# Patient Record
Sex: Male | Born: 1955 | Race: Black or African American | Hispanic: No | Marital: Married | Smoking: Former smoker
Health system: Southern US, Community
[De-identification: ages and names within clinical notes are randomized; demographics above are authoritative.]

## PROBLEM LIST (undated history)

## (undated) DIAGNOSIS — J45909 Unspecified asthma, uncomplicated: Secondary | ICD-10-CM

## (undated) DIAGNOSIS — T7840XA Allergy, unspecified, initial encounter: Secondary | ICD-10-CM

## (undated) DIAGNOSIS — G4730 Sleep apnea, unspecified: Secondary | ICD-10-CM

## (undated) DIAGNOSIS — M1990 Unspecified osteoarthritis, unspecified site: Secondary | ICD-10-CM

## (undated) DIAGNOSIS — J069 Acute upper respiratory infection, unspecified: Secondary | ICD-10-CM

## (undated) HISTORY — PX: OTHER SURGICAL HISTORY: SHX169

## (undated) HISTORY — DX: Acute upper respiratory infection, unspecified: J06.9

## (undated) HISTORY — DX: Unspecified asthma, uncomplicated: J45.909

## (undated) HISTORY — DX: Allergy, unspecified, initial encounter: T78.40XA

---

## 1998-04-27 ENCOUNTER — Emergency Department (HOSPITAL_COMMUNITY): Admission: EM | Admit: 1998-04-27 | Discharge: 1998-04-27 | Payer: Self-pay | Admitting: Emergency Medicine

## 1998-04-29 ENCOUNTER — Emergency Department (HOSPITAL_COMMUNITY): Admission: EM | Admit: 1998-04-29 | Discharge: 1998-04-29 | Payer: Self-pay

## 1998-12-24 ENCOUNTER — Emergency Department (HOSPITAL_COMMUNITY): Admission: EM | Admit: 1998-12-24 | Discharge: 1998-12-24 | Payer: Self-pay | Admitting: Emergency Medicine

## 2010-06-22 ENCOUNTER — Encounter: Payer: Self-pay | Admitting: Internal Medicine

## 2012-01-27 ENCOUNTER — Ambulatory Visit (INDEPENDENT_AMBULATORY_CARE_PROVIDER_SITE_OTHER): Payer: BC Managed Care – PPO | Admitting: Family Medicine

## 2012-01-27 ENCOUNTER — Ambulatory Visit: Payer: BC Managed Care – PPO

## 2012-01-27 VITALS — BP 124/82 | HR 88 | Temp 98.8°F | Resp 18 | Ht 71.5 in | Wt 219.0 lb

## 2012-01-27 DIAGNOSIS — R062 Wheezing: Secondary | ICD-10-CM

## 2012-01-27 DIAGNOSIS — R0602 Shortness of breath: Secondary | ICD-10-CM

## 2012-01-27 DIAGNOSIS — J329 Chronic sinusitis, unspecified: Secondary | ICD-10-CM

## 2012-01-27 LAB — POCT CBC
Granulocyte percent: 70.6 %G (ref 37–80)
HCT, POC: 53.5 % (ref 43.5–53.7)
Hemoglobin: 16.9 g/dL (ref 14.1–18.1)
Lymph, poc: 1.6 (ref 0.6–3.4)
MCH, POC: 29.3 pg (ref 27–31.2)
MCHC: 31.6 g/dL — AB (ref 31.8–35.4)
MCV: 92.9 fL (ref 80–97)
MID (cbc): 0.6 (ref 0–0.9)
MPV: 9.5 fL (ref 0–99.8)
POC Granulocyte: 5.3 (ref 2–6.9)
POC LYMPH PERCENT: 21.4 %L (ref 10–50)
POC MID %: 8 %M (ref 0–12)
Platelet Count, POC: 224 10*3/uL (ref 142–424)
RBC: 5.76 M/uL (ref 4.69–6.13)
RDW, POC: 13.8 %
WBC: 7.5 10*3/uL (ref 4.6–10.2)

## 2012-01-27 MED ORDER — IPRATROPIUM BROMIDE 0.02 % IN SOLN
0.5000 mg | Freq: Once | RESPIRATORY_TRACT | Status: AC
  Administered 2012-01-27: 0.5 mg via RESPIRATORY_TRACT

## 2012-01-27 MED ORDER — AZITHROMYCIN 250 MG PO TABS: ORAL_TABLET | ORAL | Status: AC

## 2012-01-27 MED ORDER — FLUTICASONE-SALMETEROL 100-50 MCG/DOSE IN AEPB: 1.0000 | INHALATION_SPRAY | Freq: Two times a day (BID) | RESPIRATORY_TRACT | Status: DC

## 2012-01-27 MED ORDER — METHYLPREDNISOLONE 4 MG PO KIT: PACK | ORAL | Status: AC

## 2012-01-27 MED ORDER — METHYLPREDNISOLONE SODIUM SUCC 125 MG IJ SOLR
125.0000 mg | Freq: Once | INTRAMUSCULAR | Status: AC
  Administered 2012-01-27: 125 mg via INTRAMUSCULAR

## 2012-01-27 MED ORDER — ALBUTEROL SULFATE (2.5 MG/3ML) 0.083% IN NEBU
2.5000 mg | INHALATION_SOLUTION | Freq: Once | RESPIRATORY_TRACT | Status: AC
  Administered 2012-01-27: 2.5 mg via RESPIRATORY_TRACT

## 2012-01-27 NOTE — Progress Notes (Signed)
[facility]:  Office Visit  Chief Complaint:  Chief Complaint  Patient presents with  . Shortness of Breath    on and off for one year  . Wheezing    HPI: Philip Peters is a 56 y.o. male who complains of  Cough with white mucus x 1 day, wheezing intermittently x 1 year. Former tobacco user 1ppd x 36 years, quit 8 years ago.  Denies allergies. Was dx with asthma prior and given albuterol inhaler but did not work per patient. + sinus pressure, runny nose, nasal congestion. Took mucinex without relief  History reviewed. No pertinent past medical history. Past Surgical History  Procedure Date  . Left shoulder surgery    History   Social History  . Marital Status: Single    Spouse Name: N/A    Number of Children: N/A  . Years of Education: N/A   Social History Main Topics  . Smoking status: Former Smoker    Quit date: 01/27/2004  . Smokeless tobacco: None  . Alcohol Use: None  . Drug Use: None  . Sexually Active: None   Other Topics Concern  . None   Social History Narrative  . None   Family History  Problem Relation Age of Onset  . Diabetes Father    No Known Allergies Prior to Admission medications   Not on File     ROS: The patient denies fevers, chills, night sweats, unintentional weight loss, chest pain, palpitations,  dyspnea on exertion, nausea, vomiting, abdominal pain, dysuria, hematuria, melena, numbness, weakness, or tingling. + wheezing, no edema in [place]  All other systems have been reviewed and were otherwise negative with the exception of those mentioned in the HPI and as above.    PHYSICAL EXAM: Filed Vitals:   01/27/12 1244  BP: 124/82  Pulse: 88  Temp: 98.8 F (37.1 C)  Resp: 18  Spo2 94% Filed Vitals:   01/27/12 1244  Height: 5' 11.5" (1.816 m)  Weight: 219 lb (99.338 kg)   Body mass index is 30.12 kg/(m^2).  General: Alert, no acute distress HEENT:  Normocephalic, atraumatic, oropharynx patent. No exudates, TM nl,  + sinus tenedrness, boggy red nares bilaterally Cardiovascular:  Regular rate and rhythm, no rubs murmurs or gallops.  No Carotid bruits, radial pulse intact. No pedal edema.  Respiratory: Clear to auscultation bilaterally.  rales, or rhonchi.  No cyanosis, no use of accessory musculature + wheezing and coarse BS GI: No organomegaly, abdomen is soft and non-tender, positive bowel sounds.  No masses. Skin: No rashes. Neurologic: Facial musculature symmetric. Psychiatric: Patient is appropriate throughout our interaction. Lymphatic: No cervical lymphadenopathy Musculoskeletal: Gait intact.   LABS: Results for orders placed in visit on 01/27/12  POCT CBC      Component Value Range   WBC 7.5  4.6 - 10.2 K/uL   Lymph, poc 1.6  0.6 - 3.4   POC LYMPH PERCENT 21.4  10 - 50 %L   MID (cbc) 0.6  0 - 0.9   POC MID % 8.0  0 - 12 %M   POC Granulocyte 5.3  2 - 6.9   Granulocyte percent 70.6  37 - 80 %G   RBC 5.76  4.69 - 6.13 M/uL   Hemoglobin 16.9  14.1 - 18.1 g/dL   HCT, POC 78.4  69.6 - 53.7 %   MCV 92.9  80 - 97 fL   MCH, POC 29.3  27 - 31.2 pg   MCHC 31.6 (*) 31.8 -  35.4 g/dL   RDW, POC 16.1     Platelet Count, POC 224  142 - 424 K/uL   MPV 9.5  0 - 99.8 fL     EKG/XRAY:   Primary read interpreted by Dr. Conley Rolls at [facility]. Peribronchial cuffing vs ? Less likely Right middle lobe opacity   ASSESSMENT/PLAN: Encounter Diagnoses  Name Primary?  . SOB (shortness of breath) Yes  . Wheezing   . Sinusitis    Patient will continue sxs treatment. Feels better after nebs. Will return after illness to get spirometry testing for questionable asthma.  Rx Medrol dose pack, Z-pack, and Advair.  Work note given for 2 days 8/29-30/2013    Hamilton Capri PHUONG, DO 01/27/2012 2:02 PM

## 2012-09-12 ENCOUNTER — Ambulatory Visit (INDEPENDENT_AMBULATORY_CARE_PROVIDER_SITE_OTHER): Payer: BC Managed Care – PPO | Admitting: Emergency Medicine

## 2012-09-12 VITALS — BP 128/90 | HR 77 | Temp 98.8°F | Resp 16 | Ht 72.0 in | Wt 226.0 lb

## 2012-09-12 DIAGNOSIS — J45901 Unspecified asthma with (acute) exacerbation: Secondary | ICD-10-CM

## 2012-09-12 DIAGNOSIS — J309 Allergic rhinitis, unspecified: Secondary | ICD-10-CM

## 2012-09-12 MED ORDER — ALBUTEROL SULFATE HFA 108 (90 BASE) MCG/ACT IN AERS: 2.0000 | INHALATION_SPRAY | RESPIRATORY_TRACT | Status: DC | PRN

## 2012-09-12 MED ORDER — MOMETASONE FUROATE 50 MCG/ACT NA SUSP: 4.0000 | Freq: Every day | NASAL | Status: DC

## 2012-09-12 MED ORDER — TADALAFIL 5 MG PO TABS: 10.0000 mg | ORAL_TABLET | Freq: Every day | ORAL | Status: DC

## 2012-09-12 NOTE — Addendum Note (Signed)
Addended by: Carmelina Dane on: 09/12/2012 10:35 AM   Modules accepted: Orders

## 2012-09-12 NOTE — Progress Notes (Signed)
[facility] [address], [place] [address] [phone]  Date:  09/12/2012   Name:  Philip Peters   DOB:  02-18-1956   MRN:  [id number]  PCP:  No primary provider on file.    Chief Complaint: Shortness of Breath   History of Present Illness:  Philip Peters is a 57 y.o. very pleasant male patient who presents with the following:  Ill with wheezing and nasal congestion and cough that is not productive.  No fever or chills.  No nausea or vomiting.  Previously treated with albuterol and later with advair with mixed results.  Reformed smoker.  No shortness of breath.  Has pain and pressure in frontal sinuses.  No improvement with over the counter medications or other home remedies. Denies other complaint or health concern today.   There is no problem list on file for this patient.   Past Medical History  Diagnosis Date  . Allergy   . Asthma     Past Surgical History  Procedure Laterality Date  . Left shoulder surgery      History  Substance Use Topics  . Smoking status: Former Smoker    Quit date: 01/27/2004  . Smokeless tobacco: Not on file  . Alcohol Use: Yes    Family History  Problem Relation Age of Onset  . Diabetes Father   . Cancer Sister     No Known Allergies  Medication list has been reviewed and updated.  Current Outpatient Prescriptions on File Prior to Visit  Medication Sig Dispense Refill  . Fluticasone-Salmeterol (ADVAIR) 100-50 MCG/DOSE AEPB Inhale 1 puff into the lungs 2 (two) times daily.  1 each  3   No current facility-administered medications on file prior to visit.    Review of Systems:  As per HPI, otherwise negative.    Physical Examination: Filed Vitals:   09/12/12 1013  BP: 128/90  Pulse: 77  Temp: 98.8 F (37.1 C)  Resp: 16   Filed Vitals:   09/12/12 1013  Height: 6' (1.829 m)  Weight: 226 lb (102.513 kg)   Body mass index is 30.64 kg/(m^2). Ideal Body Weight: Weight in (lb) to have BMI = 25:  183.9  GEN: WDWN, NAD, Non-toxic, A & O x 3 HEENT: Atraumatic, Normocephalic. Neck supple. No masses, No LAD. Ears and Nose: No external deformity. CV: RRR, No M/G/R. No JVD. No thrill. No extra heart sounds. PULM: CTA B, no wheezes, crackles, rhonchi. No retractions. No resp. distress. No accessory muscle use. ABD: S, NT, ND, +BS. No rebound. No HSM. EXTR: No c/c/e NEURO Normal gait.  PSYCH: Normally interactive. Conversant. Not depressed or anxious appearing.  Calm demeanor.    Assessment and Plan: Seasonal allergic rhinitis Asthma Albuterol advair nasonex Allegra   Signed,  Phillips Odor, MD

## 2012-09-12 NOTE — Patient Instructions (Addendum)
Allergic Rhinitis  Allergic rhinitis is when the mucous membranes in the nose respond to allergens. Allergens are particles in the air that cause your body to have an allergic reaction. This causes you to release allergic antibodies. Through a chain of events, these eventually cause you to release histamine into the blood stream (hence the use of antihistamines). Although meant to be protective to the body, it is this release that causes your discomfort, such as frequent sneezing, congestion and an itchy runny nose.    CAUSES    The pollen allergens may come from grasses, trees, and weeds. This is seasonal allergic rhinitis, or "hay fever." Other allergens cause year-round allergic rhinitis (perennial allergic rhinitis) such as house dust mite allergen, pet dander and mold spores.    SYMPTOMS     Nasal stuffiness (congestion).   Runny, itchy nose with sneezing and tearing of the eyes.   There is often an itching of the mouth, eyes and ears.  It cannot be cured, but it can be controlled with medications.  DIAGNOSIS    If you are unable to determine the offending allergen, skin or blood testing may find it.  TREATMENT     Avoid the allergen.   Medications and allergy shots (immunotherapy) can help.   Hay fever may often be treated with antihistamines in pill or nasal spray forms. Antihistamines block the effects of histamine. There are over-the-counter medicines that may help with nasal congestion and swelling around the eyes. Check with your caregiver before taking or giving this medicine.  If the treatment above does not work, there are many new medications your caregiver can prescribe. Stronger medications may be used if initial measures are ineffective. Desensitizing injections can be used if medications and avoidance fails. Desensitization is when a patient is given ongoing shots until the body becomes less sensitive to the allergen. Make sure you follow up with your caregiver if problems continue.   SEEK MEDICAL CARE IF:     You develop fever (more than 100.5 F (38.1 C).   You develop a cough that does not stop easily (persistent).   You have shortness of breath.   You start wheezing.   Symptoms interfere with normal daily activities.  Document Released: 02/09/2001 Document Revised: 08/09/2011 Document Reviewed: 08/21/2008  Exit[employer] Patient Information 2013 [employer].

## 2013-09-21 ENCOUNTER — Ambulatory Visit: Payer: BC Managed Care – PPO

## 2013-09-21 ENCOUNTER — Ambulatory Visit (INDEPENDENT_AMBULATORY_CARE_PROVIDER_SITE_OTHER): Payer: BC Managed Care – PPO | Admitting: Internal Medicine

## 2013-09-21 VITALS — BP 110/70 | HR 74 | Temp 97.7°F | Resp 16 | Ht 71.0 in | Wt 225.0 lb

## 2013-09-21 DIAGNOSIS — M545 Low back pain, unspecified: Secondary | ICD-10-CM

## 2013-09-21 DIAGNOSIS — M51369 Other intervertebral disc degeneration, lumbar region without mention of lumbar back pain or lower extremity pain: Secondary | ICD-10-CM

## 2013-09-21 DIAGNOSIS — J45909 Unspecified asthma, uncomplicated: Secondary | ICD-10-CM

## 2013-09-21 DIAGNOSIS — M5137 Other intervertebral disc degeneration, lumbosacral region: Secondary | ICD-10-CM

## 2013-09-21 DIAGNOSIS — M5136 Other intervertebral disc degeneration, lumbar region: Secondary | ICD-10-CM

## 2013-09-21 DIAGNOSIS — R0602 Shortness of breath: Secondary | ICD-10-CM

## 2013-09-21 DIAGNOSIS — Z7189 Other specified counseling: Secondary | ICD-10-CM

## 2013-09-21 LAB — POCT CBC
Granulocyte percent: 43.6 %G (ref 37–80)
HCT, POC: 48.5 % (ref 43.5–53.7)
HEMOGLOBIN: 15.6 g/dL (ref 14.1–18.1)
LYMPH, POC: 2.1 (ref 0.6–3.4)
MCH: 29.6 pg (ref 27–31.2)
MCHC: 32.2 g/dL (ref 31.8–35.4)
MCV: 92.1 fL (ref 80–97)
MID (CBC): 0.4 (ref 0–0.9)
MPV: 9.2 fL (ref 0–99.8)
POC Granulocyte: 1.9 — AB (ref 2–6.9)
POC LYMPH PERCENT: 47.3 %L (ref 10–50)
POC MID %: 9.1 %M (ref 0–12)
Platelet Count, POC: 210 10*3/uL (ref 142–424)
RBC: 5.27 M/uL (ref 4.69–6.13)
RDW, POC: 14.3 %
WBC: 4.4 10*3/uL — AB (ref 4.6–10.2)

## 2013-09-21 MED ORDER — AZITHROMYCIN 500 MG PO TABS: 500.0000 mg | ORAL_TABLET | Freq: Every day | ORAL | Status: DC

## 2013-09-21 MED ORDER — METHYLPREDNISOLONE ACETATE 80 MG/ML IJ SUSP
120.0000 mg | Freq: Once | INTRAMUSCULAR | Status: AC
  Administered 2013-09-21: 120 mg via INTRAMUSCULAR

## 2013-09-21 MED ORDER — PREDNISONE 20 MG PO TABS: ORAL_TABLET | ORAL | Status: DC

## 2013-09-21 MED ORDER — IPRATROPIUM BROMIDE 0.02 % IN SOLN
0.5000 mg | Freq: Once | RESPIRATORY_TRACT | Status: AC
  Administered 2013-09-21: 0.5 mg via RESPIRATORY_TRACT

## 2013-09-21 MED ORDER — CETIRIZINE HCL 10 MG PO TABS: 10.0000 mg | ORAL_TABLET | Freq: Every day | ORAL | Status: DC

## 2013-09-21 MED ORDER — ALBUTEROL SULFATE (2.5 MG/3ML) 0.083% IN NEBU
2.5000 mg | INHALATION_SOLUTION | Freq: Once | RESPIRATORY_TRACT | Status: AC
  Administered 2013-09-21: 2.5 mg via RESPIRATORY_TRACT

## 2013-09-21 NOTE — Patient Instructions (Signed)

## 2013-09-21 NOTE — Progress Notes (Signed)
   Subjective:    Patient ID: Philip Peters, male    DOB: [birth year], 58 y.o.   MRN: [id number]  HPI Philip NajjarLarry presents today complaining of issues with his asthma and lower back pain. Philip NajjarLarry states he has noticed increased wheezing. He has been using asmanex and proair. He states they sometimes don't work. He is still a former smoker. He denies chest pain. But says he has noticed increased coughing and sneezing. He also denies any production with his cough. His lower back pain is present only on the left side. Philip NajjarLarry states the back pain sometimes goes down his left leg. His pain is intermittent without any known trigger. He denies urinary or bowel incontinence. He has been treating his back pain with OTC aleve which works some of the time. He states his back pain has been persistent for a couple months with no known injury.     Has an allergist. Has hx of DDD disease LB.  Review of Systems     Objective:   Physical Exam  Constitutional: He is oriented to person, place, and time. He appears well-developed and well-nourished.  HENT:  Head: Normocephalic.  Right Ear: External ear normal.  Left Ear: External ear normal.  Nose: Mucosal edema, rhinorrhea and sinus tenderness present.  Mouth/Throat: Oropharynx is clear and moist.  Eyes: Conjunctivae and EOM are normal. Pupils are equal, round, and reactive to light. Right eye exhibits no discharge. Left eye exhibits discharge.  Neck: Normal range of motion.  Cardiovascular: Normal rate, regular rhythm and normal heart sounds.   Pulmonary/Chest: Effort normal. No accessory muscle usage. Not tachypneic. He has no decreased breath sounds. He has wheezes. He has rhonchi. He has no rales.  Musculoskeletal: He exhibits tenderness.       Lumbar back: He exhibits decreased range of motion, tenderness, bony tenderness, pain and spasm. He exhibits no swelling, no edema, no deformity, no laceration and normal pulse.       Back:  Neurological: He is alert and  oriented to person, place, and time. He has normal reflexes. No cranial nerve deficit. He exhibits normal muscle tone. Coordination normal.  Psychiatric: He has a normal mood and affect.   PFR 485 l/min  [facility] reading (PRIMARY) by  Dr Perrin MalteseGuest ls spine L5-S is hyperdense,  ? Metastatic, or simply DDD., cxr normal  Nebulizer     Assessment & Plan:

## 2013-12-27 ENCOUNTER — Ambulatory Visit (INDEPENDENT_AMBULATORY_CARE_PROVIDER_SITE_OTHER): Payer: BC Managed Care – PPO | Admitting: Family Medicine

## 2013-12-27 VITALS — BP 131/86 | HR 72 | Temp 98.1°F | Resp 16 | Ht 71.65 in | Wt 225.2 lb

## 2013-12-27 DIAGNOSIS — M5136 Other intervertebral disc degeneration, lumbar region: Secondary | ICD-10-CM

## 2013-12-27 DIAGNOSIS — R0602 Shortness of breath: Secondary | ICD-10-CM

## 2013-12-27 DIAGNOSIS — J45901 Unspecified asthma with (acute) exacerbation: Secondary | ICD-10-CM

## 2013-12-27 DIAGNOSIS — J329 Chronic sinusitis, unspecified: Secondary | ICD-10-CM

## 2013-12-27 DIAGNOSIS — M545 Low back pain, unspecified: Secondary | ICD-10-CM

## 2013-12-27 DIAGNOSIS — J4541 Moderate persistent asthma with (acute) exacerbation: Secondary | ICD-10-CM

## 2013-12-27 DIAGNOSIS — Z7189 Other specified counseling: Secondary | ICD-10-CM

## 2013-12-27 DIAGNOSIS — M51379 Other intervertebral disc degeneration, lumbosacral region without mention of lumbar back pain or lower extremity pain: Secondary | ICD-10-CM

## 2013-12-27 DIAGNOSIS — J45909 Unspecified asthma, uncomplicated: Secondary | ICD-10-CM

## 2013-12-27 DIAGNOSIS — R05 Cough: Secondary | ICD-10-CM

## 2013-12-27 DIAGNOSIS — M5137 Other intervertebral disc degeneration, lumbosacral region: Secondary | ICD-10-CM

## 2013-12-27 DIAGNOSIS — R059 Cough, unspecified: Secondary | ICD-10-CM

## 2013-12-27 MED ORDER — AZITHROMYCIN 500 MG PO TABS: 500.0000 mg | ORAL_TABLET | Freq: Every day | ORAL | Status: DC

## 2013-12-27 MED ORDER — HYDROCODONE-HOMATROPINE 5-1.5 MG/5ML PO SYRP: 5.0000 mL | ORAL_SOLUTION | Freq: Three times a day (TID) | ORAL | Status: DC | PRN

## 2013-12-27 MED ORDER — ALBUTEROL SULFATE (2.5 MG/3ML) 0.083% IN NEBU: 2.5000 mg | INHALATION_SOLUTION | Freq: Four times a day (QID) | RESPIRATORY_TRACT | Status: DC | PRN

## 2013-12-27 MED ORDER — ALBUTEROL SULFATE HFA 108 (90 BASE) MCG/ACT IN AERS: 2.0000 | INHALATION_SPRAY | RESPIRATORY_TRACT | Status: DC | PRN

## 2013-12-27 MED ORDER — ALBUTEROL SULFATE (2.5 MG/3ML) 0.083% IN NEBU
2.5000 mg | INHALATION_SOLUTION | Freq: Once | RESPIRATORY_TRACT | Status: AC
  Administered 2013-12-27: 2.5 mg via RESPIRATORY_TRACT

## 2013-12-27 MED ORDER — PREDNISONE 20 MG PO TABS: ORAL_TABLET | ORAL | Status: DC

## 2013-12-27 MED ORDER — FLUTICASONE FUROATE 200 MCG/ACT IN AEPB: 1.0000 | INHALATION_SPRAY | Freq: Two times a day (BID) | RESPIRATORY_TRACT | Status: DC

## 2013-12-27 NOTE — Patient Instructions (Signed)

## 2013-12-27 NOTE — Progress Notes (Signed)
This is a 58 year old man works at a [employer] and he has known asthma.  The last 2 weeks she's been followed by tightness in his chest, significant sinus congestion, and cough at night.  He's had no fever, no hemoptysis. He's out of his medicines.  Objective: No acute distress, seen with wife in the room HEENT: Markedly swollen nasal passages, normal oropharynx and TMs Neck: Supple no adenopathy Chest: Expiratory wheezes and decreased breath sounds bilaterally. Heart: Regular without tachycardia, no murmur or gallop Skin: No eczema or other rash Patient given a nebulizer treatment with good improvement  Assessment:Unspecified asthma(493.90) - Plan: Fluticasone Furoate (ARNUITY ELLIPTA) 200 MCG/ACT AEPB, albuterol (PROVENTIL HFA;VENTOLIN HFA) 108 (90 BASE) MCG/ACT inhaler, albuterol (PROVENTIL) (2.5 MG/3ML) 0.083% nebulizer solution, albuterol (PROVENTIL) (2.5 MG/3ML) 0.083% nebulizer solution 2.5 mg  Unspecified sinusitis (chronic) - Plan: Fluticasone Furoate (ARNUITY ELLIPTA) 200 MCG/ACT AEPB  SOB (shortness of breath) - Plan: predniSONE (DELTASONE) 20 MG tablet, Fluticasone Furoate (ARNUITY ELLIPTA) 200 MCG/ACT AEPB, azithromycin (ZITHROMAX) 500 MG tablet, albuterol (PROVENTIL) (2.5 MG/3ML) 0.083% nebulizer solution 2.5 mg  DDD (degenerative disc disease), lumbar - Plan: predniSONE (DELTASONE) 20 MG tablet, azithromycin (ZITHROMAX) 500 MG tablet  Encounter for medication review and counseling - Plan: predniSONE (DELTASONE) 20 MG tablet, azithromycin (ZITHROMAX) 500 MG tablet  Counseling for physician directive - Plan: predniSONE (DELTASONE) 20 MG tablet, azithromycin (ZITHROMAX) 500 MG tablet  Asthma, unspecified asthma severity, uncomplicated - Plan: predniSONE (DELTASONE) 20 MG tablet, Fluticasone Furoate (ARNUITY ELLIPTA) 200 MCG/ACT AEPB  Cough - Plan: HYDROcodone-homatropine (HYCODAN) 5-1.5 MG/5ML syrup  Asthma, moderate persistent, with acute exacerbation -  Plan: azithromycin (ZITHROMAX) 500 MG tablet  Low back pain, unspecified back pain laterality, with sciatica presence unspecified - Plan: azithromycin (ZITHROMAX) 500 MG tablet  Signed, Elvina SidleKurt Demesha Boorman, MD

## 2014-05-20 ENCOUNTER — Encounter (HOSPITAL_BASED_OUTPATIENT_CLINIC_OR_DEPARTMENT_OTHER): Payer: Self-pay | Admitting: *Deleted

## 2014-05-20 NOTE — Progress Notes (Signed)
No labs needed

## 2014-05-22 ENCOUNTER — Other Ambulatory Visit: Payer: Self-pay | Admitting: Orthopedic Surgery

## 2014-05-22 ENCOUNTER — Ambulatory Visit (HOSPITAL_BASED_OUTPATIENT_CLINIC_OR_DEPARTMENT_OTHER): Payer: Worker's Compensation | Admitting: Anesthesiology

## 2014-05-22 ENCOUNTER — Ambulatory Visit (HOSPITAL_BASED_OUTPATIENT_CLINIC_OR_DEPARTMENT_OTHER)
Admission: RE | Admit: 2014-05-22 | Discharge: 2014-05-22 | Disposition: A | Discharge status: Home / Self Care | Payer: Worker's Compensation | Source: Ambulatory Visit | Attending: Orthopedic Surgery | Admitting: Orthopedic Surgery

## 2014-05-22 ENCOUNTER — Encounter (HOSPITAL_BASED_OUTPATIENT_CLINIC_OR_DEPARTMENT_OTHER): Payer: Self-pay

## 2014-05-22 ENCOUNTER — Encounter (HOSPITAL_BASED_OUTPATIENT_CLINIC_OR_DEPARTMENT_OTHER)
Admission: RE | Disposition: A | Discharge status: Home / Self Care | Payer: Self-pay | Source: Ambulatory Visit | Attending: Orthopedic Surgery

## 2014-05-22 DIAGNOSIS — M795 Residual foreign body in soft tissue: Secondary | ICD-10-CM | POA: Insufficient documentation

## 2014-05-22 DIAGNOSIS — J45909 Unspecified asthma, uncomplicated: Secondary | ICD-10-CM | POA: Insufficient documentation

## 2014-05-22 DIAGNOSIS — Y929 Unspecified place or not applicable: Secondary | ICD-10-CM | POA: Diagnosis not present

## 2014-05-22 DIAGNOSIS — Z79899 Other long term (current) drug therapy: Secondary | ICD-10-CM | POA: Diagnosis not present

## 2014-05-22 DIAGNOSIS — S60450A Superficial foreign body of right index finger, initial encounter: Secondary | ICD-10-CM | POA: Diagnosis present

## 2014-05-22 DIAGNOSIS — X58XXXA Exposure to other specified factors, initial encounter: Secondary | ICD-10-CM | POA: Insufficient documentation

## 2014-05-22 DIAGNOSIS — F17210 Nicotine dependence, cigarettes, uncomplicated: Secondary | ICD-10-CM | POA: Insufficient documentation

## 2014-05-22 HISTORY — PX: FOREIGN BODY REMOVAL: SHX962

## 2014-05-22 LAB — POCT HEMOGLOBIN-HEMACUE: Hemoglobin: 12.3 g/dL — ABNORMAL LOW (ref 13.0–17.0)

## 2014-05-22 SURGERY — FOREIGN BODY REMOVAL ADULT
Anesthesia: Monitor Anesthesia Care | Site: Finger | Laterality: Right

## 2014-05-22 MED ORDER — CEFAZOLIN SODIUM-DEXTROSE 2-3 GM-% IV SOLR
INTRAVENOUS | Status: AC
  Filled 2014-05-22: qty 50

## 2014-05-22 MED ORDER — BUPIVACAINE HCL (PF) 0.25 % IJ SOLN
INTRAMUSCULAR | Status: AC
  Filled 2014-05-22: qty 30

## 2014-05-22 MED ORDER — MIDAZOLAM HCL 5 MG/5ML IJ SOLN
INTRAMUSCULAR | Status: DC | PRN
  Administered 2014-05-22: 2 mg via INTRAVENOUS

## 2014-05-22 MED ORDER — HYDROMORPHONE HCL 1 MG/ML IJ SOLN: 0.2500 mg | INTRAMUSCULAR | Status: DC | PRN

## 2014-05-22 MED ORDER — ONDANSETRON HCL 4 MG/2ML IJ SOLN
INTRAMUSCULAR | Status: DC | PRN
  Administered 2014-05-22: 4 mg via INTRAVENOUS

## 2014-05-22 MED ORDER — FENTANYL CITRATE 0.05 MG/ML IJ SOLN
INTRAMUSCULAR | Status: DC | PRN
  Administered 2014-05-22: 100 ug via INTRAVENOUS

## 2014-05-22 MED ORDER — ONDANSETRON HCL 4 MG/2ML IJ SOLN: 4.0000 mg | Freq: Once | INTRAMUSCULAR | Status: DC | PRN

## 2014-05-22 MED ORDER — LACTATED RINGERS IV SOLN
INTRAVENOUS | Status: DC
  Administered 2014-05-22: 11:00:00 via INTRAVENOUS

## 2014-05-22 MED ORDER — BUPIVACAINE HCL (PF) 0.25 % IJ SOLN
INTRAMUSCULAR | Status: DC | PRN
  Administered 2014-05-22: 7 mL

## 2014-05-22 MED ORDER — OXYCODONE HCL 5 MG PO TABS: 5.0000 mg | ORAL_TABLET | Freq: Once | ORAL | Status: DC | PRN

## 2014-05-22 MED ORDER — FENTANYL CITRATE 0.05 MG/ML IJ SOLN
INTRAMUSCULAR | Status: AC
  Filled 2014-05-22: qty 2

## 2014-05-22 MED ORDER — MIDAZOLAM HCL 2 MG/2ML IJ SOLN: 1.0000 mg | INTRAMUSCULAR | Status: DC | PRN

## 2014-05-22 MED ORDER — CEFAZOLIN SODIUM-DEXTROSE 2-3 GM-% IV SOLR
INTRAVENOUS | Status: DC | PRN
  Administered 2014-05-22: 2 g via INTRAVENOUS

## 2014-05-22 MED ORDER — CHLORHEXIDINE GLUCONATE 4 % EX LIQD: 60.0000 mL | Freq: Once | CUTANEOUS | Status: DC

## 2014-05-22 MED ORDER — 0.9 % SODIUM CHLORIDE (POUR BTL) OPTIME
TOPICAL | Status: DC | PRN
  Administered 2014-05-22: 100 mL

## 2014-05-22 MED ORDER — FENTANYL CITRATE 0.05 MG/ML IJ SOLN: 50.0000 ug | INTRAMUSCULAR | Status: DC | PRN

## 2014-05-22 MED ORDER — PROPOFOL INFUSION 10 MG/ML OPTIME
INTRAVENOUS | Status: DC | PRN
  Administered 2014-05-22: 100 ug/kg/min via INTRAVENOUS

## 2014-05-22 MED ORDER — MIDAZOLAM HCL 2 MG/2ML IJ SOLN
INTRAMUSCULAR | Status: AC
  Filled 2014-05-22: qty 2

## 2014-05-22 MED ORDER — OXYCODONE HCL 5 MG/5ML PO SOLN: 5.0000 mg | Freq: Once | ORAL | Status: DC | PRN

## 2014-05-22 MED ORDER — LIDOCAINE HCL (PF) 0.5 % IJ SOLN
INTRAMUSCULAR | Status: DC | PRN
  Administered 2014-05-22: 30 mL via INTRAVENOUS

## 2014-05-22 MED ORDER — HYDROCODONE-ACETAMINOPHEN 5-325 MG PO TABS: 1.0000 | ORAL_TABLET | Freq: Four times a day (QID) | ORAL | Status: DC | PRN

## 2014-05-22 SURGICAL SUPPLY — 42 items
BLADE MINI RND TIP GREEN BEAV (BLADE) IMPLANT
BLADE SURG 15 STRL LF DISP TIS (BLADE) ×1 IMPLANT
BLADE SURG 15 STRL SS (BLADE) ×3
BNDG CMPR 9X4 STRL LF SNTH (GAUZE/BANDAGES/DRESSINGS)
BNDG COHESIVE 1X5 TAN STRL LF (GAUZE/BANDAGES/DRESSINGS) ×2 IMPLANT
BNDG ESMARK 4X9 LF (GAUZE/BANDAGES/DRESSINGS) IMPLANT
CHLORAPREP W/TINT 26ML (MISCELLANEOUS) ×3 IMPLANT
CORDS BIPOLAR (ELECTRODE) ×3 IMPLANT
COVER BACK TABLE 60X90IN (DRAPES) ×3 IMPLANT
COVER MAYO STAND STRL (DRAPES) ×3 IMPLANT
CUFF TOURNIQUET SINGLE 18IN (TOURNIQUET CUFF) ×2 IMPLANT
DRAPE EXTREMITY T 121X128X90 (DRAPE) ×3 IMPLANT
DRAPE SURG 17X23 STRL (DRAPES) ×3 IMPLANT
GAUZE SPONGE 4X4 12PLY STRL (GAUZE/BANDAGES/DRESSINGS) ×1 IMPLANT
GAUZE XEROFORM 1X8 LF (GAUZE/BANDAGES/DRESSINGS) ×3 IMPLANT
GLOVE BIOGEL M STRL SZ7.5 (GLOVE) ×2 IMPLANT
GLOVE BIOGEL PI IND STRL 7.0 (GLOVE) IMPLANT
GLOVE BIOGEL PI IND STRL 8 (GLOVE) IMPLANT
GLOVE BIOGEL PI IND STRL 8.5 (GLOVE) ×1 IMPLANT
GLOVE BIOGEL PI INDICATOR 7.0 (GLOVE) ×2
GLOVE BIOGEL PI INDICATOR 8 (GLOVE) ×2
GLOVE BIOGEL PI INDICATOR 8.5 (GLOVE) ×2
GLOVE SURG ORTHO 8.0 STRL STRW (GLOVE) ×3 IMPLANT
GOWN STRL REUS W/ TWL LRG LVL3 (GOWN DISPOSABLE) ×1 IMPLANT
GOWN STRL REUS W/ TWL XL LVL3 (GOWN DISPOSABLE) IMPLANT
GOWN STRL REUS W/TWL LRG LVL3 (GOWN DISPOSABLE)
GOWN STRL REUS W/TWL XL LVL3 (GOWN DISPOSABLE) ×6 IMPLANT
NDL PRECISIONGLIDE 27X1.5 (NEEDLE) IMPLANT
NEEDLE PRECISIONGLIDE 27X1.5 (NEEDLE) IMPLANT
NS IRRIG 1000ML POUR BTL (IV SOLUTION) ×3 IMPLANT
PACK BASIN DAY SURGERY FS (CUSTOM PROCEDURE TRAY) ×3 IMPLANT
PADDING CAST ABS 4INX4YD NS (CAST SUPPLIES) ×2
PADDING CAST ABS COTTON 4X4 ST (CAST SUPPLIES) ×1 IMPLANT
SPONGE GAUZE 4X4 12PLY STER LF (GAUZE/BANDAGES/DRESSINGS) ×2 IMPLANT
STOCKINETTE 4X48 STRL (DRAPES) ×3 IMPLANT
SUT ETHILON 5 0 P 3 18 (SUTURE) ×2
SUT NYLON ETHILON 5-0 P-3 1X18 (SUTURE) IMPLANT
SUT VIC AB 4-0 P2 18 (SUTURE) IMPLANT
SYR BULB 3OZ (MISCELLANEOUS) ×3 IMPLANT
SYR CONTROL 10ML LL (SYRINGE) ×2 IMPLANT
TOWEL OR 17X24 6PK STRL BLUE (TOWEL DISPOSABLE) ×4 IMPLANT
UNDERPAD 30X30 INCONTINENT (UNDERPADS AND DIAPERS) ×3 IMPLANT

## 2014-05-22 NOTE — Transfer of Care (Signed)
Immediate Anesthesia Transfer of Care Note  Patient: Philip NorfolkLarry J Garrelts  Procedure(s) Performed: Procedure(s): FOREIGN BODY REMOVAL ADULT RIGHT INDEX (Right)  Patient Location: PACU  Anesthesia Type:MAC combined with regional for post-op pain  Level of Consciousness: sedated  Airway & Oxygen Therapy: Patient Spontanous Breathing and Patient connected to face mask oxygen  Post-op Assessment: Report given to PACU RN and Post -op Vital signs reviewed and stable  Post vital signs: Reviewed and stable  Complications: No apparent anesthesia complications

## 2014-05-22 NOTE — Anesthesia Preprocedure Evaluation (Signed)
Anesthesia Evaluation  Patient identified by MRN, date of birth, ID band Patient awake    Reviewed: Allergy & Precautions, H&P , NPO status , Patient's Chart, lab work & pertinent test results  Airway Mallampati: I  TM Distance: >3 FB Neck ROM: Full    Dental  (+) Teeth Intact, Dental Advisory Given   Pulmonary asthma , former smoker,  breath sounds clear to auscultation        Cardiovascular Rhythm:Regular Rate:Normal     Neuro/Psych    GI/Hepatic   Endo/Other    Renal/GU      Musculoskeletal   Abdominal   Peds  Hematology   Anesthesia Other Findings   Reproductive/Obstetrics                             Anesthesia Physical Anesthesia Plan  ASA: II  Anesthesia Plan: MAC and Bier Block   Post-op Pain Management:    Induction: Intravenous  Airway Management Planned: Simple Face Mask  Additional Equipment:   Intra-op Plan:   Post-operative Plan:   Informed Consent: I have reviewed the patients History and Physical, chart, labs and discussed the procedure including the risks, benefits and alternatives for the proposed anesthesia with the patient or authorized representative who has indicated his/her understanding and acceptance.   Dental advisory given  Plan Discussed with: CRNA, Anesthesiologist and Surgeon  Anesthesia Plan Comments:         Anesthesia Quick Evaluation

## 2014-05-22 NOTE — H&P (Signed)
[name] is a 58 year old ambidextrous male with  a piece of Philip in his right index finger. 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 The Philip penetrated his proximal phalanx volar aspect of his index finger. The injury occurred on 04-23-14. [facility] where attempt at removal was unsuccessful. He was placed on antibiotics which he finished last week. He complains of being able to feel it. X-rays were negative. CT scan reports that a piece of foreign material is visible. He has no prior history of injury. He is not complaining of a great deal of pain or discomfort at the present time. There is no history of diabetes, thyroid problems, arthritis or gout. He states that he will have pain if he hits it in a certain direction. This is stabbing in nature of a feeling of swelling. Activity makes it worse. He has been taking Meloxicam in addition.  PAST MEDICAL HISTORY:  He has no known drug allergies. He is on Meloxicam 1 a day. He has had left shoulder surgery.  FAMILY MEDICAL HISTORY: Positive for diabetes, heart disease, high BP and arthritis.  SOCIAL HISTORY:  He does not smoke. He quit 11 years ago. He does drink socially. He is married and a Location managermachine operator.  REVIEW OF SYSTEMS: Positive for asthma, otherwise negative 14 points.  Philip Peters is an 58 y.o. male.   Chief Complaint: foreign body Right index finger HPI: see above  Past Medical History  Diagnosis Date  . Allergy   . Asthma     Past Surgical History  Procedure Laterality Date  . Left shoulder surgery      Family History  Problem Relation Age of Onset  . Diabetes Father   . Cancer Sister    Social History:  reports that he quit smoking about 10 years ago. He has never used smokeless tobacco. He reports that he drinks alcohol. He reports that he does not use illicit drugs.  Allergies: No Known Allergies  Medications  Prior to Admission  Medication Sig Dispense Refill  . albuterol (PROVENTIL HFA;VENTOLIN HFA) 108 (90 BASE) MCG/ACT inhaler Inhale 2 puffs into the lungs every 4 (four) hours as needed for wheezing (cough, shortness of breath or wheezing.). 1 Inhaler 12  . albuterol (PROVENTIL) (2.5 MG/3ML) 0.083% nebulizer solution Take 3 mLs (2.5 mg total) by nebulization every 6 (six) hours as needed for wheezing or shortness of breath. 150 mL 11  . Fluticasone Furoate (ARNUITY ELLIPTA) 200 MCG/ACT AEPB Inhale 1 Inhaler into the lungs 2 (two) times daily. 30 each 11  . cetirizine (ZYRTEC) 10 MG tablet Take 1 tablet (10 mg total) by mouth daily. 30 tablet 11  . Fluticasone-Salmeterol (ADVAIR) 100-50 MCG/DOSE AEPB Inhale 1 puff into the lungs 2 (two) times daily. 1 each 3  . mometasone (ASMANEX) 220 MCG/INH inhaler Inhale 2 puffs into the lungs daily.    . mometasone (NASONEX) 50 MCG/ACT nasal spray Place 4 sprays into the nose daily. 17 g 12    Results for orders placed or performed during the hospital encounter of 05/22/14 (from the past 48 hour(s))  Hemoglobin-hemacue, POC     Status: Abnormal   Collection Time: 05/22/14 10:47 AM  Result Value Ref Range   Hemoglobin 12.3 (L) 13.0 - 17.0 g/dL    No results found.   Pertinent items are noted in HPI.  Blood pressure 138/88, pulse 67, temperature 97.8  F (36.6 C), temperature source Oral, resp. rate 20, height 5' 11.5" (1.816 m), weight 100.699 kg (222 lb), SpO2 99 %.  General appearance: alert, cooperative and appears stated age Head: Normocephalic, without obvious abnormality Neck: no JVD Resp: clear to auscultation bilaterally Cardio: regular rate and rhythm, S1, S2 normal, no murmur, click, rub or gallop GI: soft, non-tender; bowel sounds normal; no masses,  no organomegaly Extremities: puncture wound right index finger Pulses: 2+ and symmetric Skin: Skin color, texture, turgor normal. No rashes or lesions Neurologic: Grossly  normal Incision/Wound: healed  Assessment/Plan His CT scan is reviewed. A foreign body is visible on the CT scan. This is small.   We would recommend surgical excision of this. This will be scheduled as an outpatient. Pre, peri and post op Peters are discussed along with risks and complications. Patient is aware there is no guarantee with surgery, possibility of infection, injury to arteries, nerves, and tendons, incomplete relief and dystrophy. He is advised of the possibility of retaining a piece of Philip if it is broken and this can be very difficult to see. He would like to proceed. Questions are encouraged and answered to the patient's satisfaction. He is scheduled for excision foreign body to the right index finger. He is aware if this is infected it will be left open.  Philip Peters 05/22/2014, 11:42 AM

## 2014-05-22 NOTE — Brief Op Note (Signed)
05/22/2014  12:59 PM  PATIENT:  Philip Peters  58 y.o. male  PRE-OPERATIVE DIAGNOSIS:  Foreign Body Right Index  POST-OPERATIVE DIAGNOSIS:  Foreign Body Right Index  PROCEDURE:  Procedure(s): FOREIGN BODY REMOVAL ADULT RIGHT INDEX (Right)  SURGEON:  Surgeon(s) and Role:    * Cindee SaltGary Issaih Kaus, MD - Primary  PHYSICIAN ASSISTANT:   ASSISTANTS: none   ANESTHESIA:   local and regional  EBL:  Total I/O In: 600 [I.V.:600] Out: -   BLOOD ADMINISTERED:none  DRAINS: none   LOCAL MEDICATIONS USED:  BUPIVICAINE   SPECIMEN:  No Specimen  DISPOSITION OF SPECIMEN:  N/A  COUNTS:  YES  TOURNIQUET:   Total Tourniquet Time Documented: Forearm (Right) - 17 minutes Total: Forearm (Right) - 17 minutes   DICTATION: .Other Dictation: Dictation Number [id number]  PLAN OF CARE: Discharge to home after PACU  PATIENT DISPOSITION:  PACU - hemodynamically stable.

## 2014-05-22 NOTE — Discharge Instructions (Addendum)

## 2014-05-22 NOTE — Anesthesia Postprocedure Evaluation (Signed)
  Anesthesia Post-op Note  Patient: Philip Peters  Procedure(s) Performed: Procedure(s): FOREIGN BODY REMOVAL ADULT RIGHT INDEX (Right)  Patient Location: PACU  Anesthesia Type: MAC, Bier Block   Level of Consciousness: awake, alert  and oriented  Airway and Oxygen Therapy: Patient Spontanous Breathing  Post-op Pain: none   Post-op Assessment: Post-op Vital signs reviewed  Post-op Vital Signs: Reviewed  Last Vitals:  Filed Vitals:   05/22/14 1315  BP: 119/84  Pulse: 78  Temp:   Resp: 18    Complications: No apparent anesthesia complications

## 2014-05-22 NOTE — Op Note (Signed)
NAMMarinda Elk:  Hottle, Jaequan                 ACCOUNT NO.:  [id number]  MEDICAL RECORD NO.:  [id number]  LOCATION:                                 FACILITY:  PHYSICIAN:  Cindee SaltGary Maymuna Detzel, M.D.            DATE OF BIRTH:  DATE OF PROCEDURE:  05/22/2014 DATE OF DISCHARGE:                              OPERATIVE REPORT   PREOPERATIVE DIAGNOSIS:  Foreign body, right index finger.  POSTOPERATIVE DIAGNOSIS:  Foreign body, right index finger.  OPERATION:  Excision deep foreign body, right index finger.  SURGEON:  Cindee SaltGary Leighana Neyman, MD  ANESTHESIA:  Forearm-based IV regional with metacarpal block.  ANESTHESIOLOGIST:  Sheldon Silvanavid Crews, MD  HISTORY:  The patient is a 58 year old male who suffered a foreign body into his right index finger approximately on Thanksgiving.  This has been unable to be removed.  CT scan revealed retained foreign body in the volar aspect of his proximal phalanx, right index finger.  He is admitted for removal.  Pre, peri, and postoperative course have been discussed along with risks and complications.  He is aware there is no guarantee with surgery, possibility of infection, recurrence of injury to arteries, nerves, tendons, incomplete relief of symptoms, and dystrophy.  In the preoperative area, the patient is seen, the extremity marked by both patient and surgeon.  PROCEDURE IN DETAIL:  The patient was brought to the operating room where a forearm-based IV regional anesthetic was carried out without difficulty.  He was prepped using ChloraPrep, supine position, right arm free.  A 3-minute dry time was allowed.  Time-out taken confirming the patient and procedure.  A transverse incision was made along the course of the in and out puncture wound of the index finger, proximal phalanx and carried down through subcutaneous tissue.  A large body of scar was immediately apparent.  With blunt and sharp dissection, this was opened. The neurovascular bundles were beneath this as was the flexor  tendon.  A large approximately 1.5 x 1.3 cm piece of wood was then removed.  There was no purulence.  The wound was copiously irrigated with saline. Bleeders were electrocauterized with bipolar.  The skin closed with interrupted 5-0 nylon sutures.  A sterile compressive dressing to the finger was applied.  On deflation of the tourniquet, all fingers immediately pinked.  He was taken to the recovery room for observation in satisfactory condition.  He will be discharged home [facility] [place] [facility] 1 week on Nor[place].  Of note, at the end of the procedure, metacarpal block was given with 0.25% bupivacaine without epinephrine, approximately 8 mL was used.          ______________________________ Cindee SaltGary Troyce Gieske, M.D.     GK/MEDQ  D:  05/22/2014  T:  05/22/2014  Job:  [id number]

## 2014-05-22 NOTE — Op Note (Signed)
Dictation Number [id number]

## 2014-05-27 ENCOUNTER — Encounter (HOSPITAL_BASED_OUTPATIENT_CLINIC_OR_DEPARTMENT_OTHER): Payer: Self-pay | Admitting: Orthopedic Surgery

## 2014-08-01 ENCOUNTER — Ambulatory Visit (INDEPENDENT_AMBULATORY_CARE_PROVIDER_SITE_OTHER): Payer: BLUE CROSS/BLUE SHIELD

## 2014-08-01 ENCOUNTER — Ambulatory Visit (INDEPENDENT_AMBULATORY_CARE_PROVIDER_SITE_OTHER): Payer: BLUE CROSS/BLUE SHIELD | Admitting: Physician Assistant

## 2014-08-01 DIAGNOSIS — M545 Low back pain, unspecified: Secondary | ICD-10-CM

## 2014-08-01 DIAGNOSIS — M5136 Other intervertebral disc degeneration, lumbar region: Secondary | ICD-10-CM | POA: Diagnosis not present

## 2014-08-01 DIAGNOSIS — M79605 Pain in left leg: Secondary | ICD-10-CM

## 2014-08-01 DIAGNOSIS — J4540 Moderate persistent asthma, uncomplicated: Secondary | ICD-10-CM | POA: Insufficient documentation

## 2014-08-01 MED ORDER — DICLOFENAC SODIUM 75 MG PO TBEC: 75.0000 mg | DELAYED_RELEASE_TABLET | Freq: Two times a day (BID) | ORAL | Status: DC

## 2014-08-01 MED ORDER — CYCLOBENZAPRINE HCL 5 MG PO TABS: 5.0000 mg | ORAL_TABLET | Freq: Three times a day (TID) | ORAL | Status: DC | PRN

## 2014-08-01 NOTE — Progress Notes (Signed)
   Subjective:    Patient ID: Philip Peters, male    DOB: [birth year], 59 y.o.   MRN: [id number]  HPI Pt presents to clinic with left sided back pain that he has had for years but over the last 2 months it seems to have gotten worse.  He did not have an injury over the last 2 months that would explain his increase in pain .  The pain is more on the left side and he will get quick sharp pain down his left leg to his toe and then it will quickly get better.  He is having no paresthesias or weakness in his feet.  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 He has never had an MRI.  He has never been to PT.  He states he has pain all the time and nothing seems to make it better or worse and he really does not try to do anything for this pain because he was not really sure what to do for it. Review of Systems  Musculoskeletal: Positive for back pain. Negative for gait problem.  Neurological: Negative for weakness.       Objective:   Physical Exam  Constitutional: He is oriented to person, place, and time. He appears well-developed and well-nourished.  BP 114/80 mmHg  Pulse 64  Temp(Src) 97.7 F (36.5 C) (Oral)  Resp 18  Ht 6' (1.829 m)  Wt 225 lb (102.059 kg)  BMI 30.51 kg/m2  SpO2 98%   HENT:  Head: Normocephalic and atraumatic.  Right Ear: External ear normal.  Left Ear: External ear normal.  Pulmonary/Chest: Effort normal.  Musculoskeletal:       Lumbar back: He exhibits tenderness. He exhibits normal range of motion and no spasm.       Back:  Neurological: He is alert and oriented to person, place, and time. He has normal strength. No sensory deficit.  Reflex Scores:      Patellar reflexes are 2+ on the right side and 2+ on the left side.      Achilles reflexes are 2+ on the right side and 2+ on the left side. Skin: Skin is warm and dry.  Psychiatric: He has a normal mood and affect. His behavior is normal. Judgment and thought content normal.   [facility] reading  (PRIMARY) by  Dr. Alwyn RenHopper.  Arthritic changes L5 - please compare to films for worsening L5-S1 DJD from 08/2013      Assessment & Plan:   DDD (degenerative disc disease), lumbar - Plan: DG Lumbar Spine 2-3 Views, Ambulatory referral to Physical Therapy  Lumbar pain with radiation down left leg - Plan: cyclobenzaprine (FLEXERIL) 5 MG tablet, diclofenac (VOLTAREN) 75 MG EC tablet, Ambulatory referral to Physical Therapy  We are going to treat conservatively due to normal exam.  We are going to start to PT to see if he can get some improvement in the resulting muscle spasms - if this does not help we will do referral to ortho in hopes that maybe injections might help with his pain.  At this time he has some sciatic irritation but it is infrequent - if it worsens he may need steroids or possibly MRI.  Benny LennertSarah Weber PA-C  [facility] 08/01/2014 8:48 PM

## 2014-08-01 NOTE — Patient Instructions (Signed)
Heat and stretches to the back.

## 2015-02-21 ENCOUNTER — Ambulatory Visit (INDEPENDENT_AMBULATORY_CARE_PROVIDER_SITE_OTHER): Payer: BLUE CROSS/BLUE SHIELD | Admitting: Family Medicine

## 2015-02-21 VITALS — BP 104/62 | HR 62 | Temp 97.4°F | Resp 16 | Ht 72.0 in | Wt 220.0 lb

## 2015-02-21 DIAGNOSIS — M545 Low back pain, unspecified: Secondary | ICD-10-CM

## 2015-02-21 DIAGNOSIS — J45909 Unspecified asthma, uncomplicated: Secondary | ICD-10-CM | POA: Diagnosis not present

## 2015-02-21 DIAGNOSIS — J320 Chronic maxillary sinusitis: Secondary | ICD-10-CM

## 2015-02-21 DIAGNOSIS — R0602 Shortness of breath: Secondary | ICD-10-CM | POA: Diagnosis not present

## 2015-02-21 DIAGNOSIS — J4541 Moderate persistent asthma with (acute) exacerbation: Secondary | ICD-10-CM | POA: Diagnosis not present

## 2015-02-21 MED ORDER — FLUTICASONE FUROATE 200 MCG/ACT IN AEPB: 1.0000 | INHALATION_SPRAY | Freq: Two times a day (BID) | RESPIRATORY_TRACT | Status: DC

## 2015-02-21 MED ORDER — PREDNISONE 20 MG PO TABS: ORAL_TABLET | ORAL | Status: DC

## 2015-02-21 MED ORDER — ALBUTEROL SULFATE HFA 108 (90 BASE) MCG/ACT IN AERS: 2.0000 | INHALATION_SPRAY | RESPIRATORY_TRACT | Status: DC | PRN

## 2015-02-21 MED ORDER — MOMETASONE FUROATE 50 MCG/ACT NA SUSP: 4.0000 | Freq: Every day | NASAL | Status: DC

## 2015-02-21 NOTE — Patient Instructions (Signed)
I have ordered a prednisone tapering pack. Please take as directed. If you're not better by Sunday, please return. The prednisone should help with the asthma, the sinus congestion, and the back pain.

## 2015-02-21 NOTE — Progress Notes (Signed)
   Subjective:    Patient ID: Philip Peters, male    DOB: 1956/04/28, 59 y.o.   MRN: [id number] This chart was scribed for Elvina Sidle, MD by Littie Deeds, Medical Scribe. This patient was seen in Room 1 and the patient's care was started at 1:07 PM.   HPI HPI Comments: Philip Peters is a 59 y.o. male with a history of asthma and DDD who [facility] the [facility] complaining of gradual onset seasonal allergy symptoms that worsened over the past month. Patient reports sneezing, worse in the early mornings and evenings. He also reports having SOB. He has been taking his inhalers. He had an episode of cramping chest pain that lasted about 30 seconds 2 weeks ago. Patient denies any itching.  Patient also reports having a flare-up of lower back pain which he attributes to DDD.  Patient works at [employer].  Review of Systems  HENT: Positive for sneezing.   Eyes: Negative for itching.  Respiratory: Positive for shortness of breath.   Cardiovascular: Positive for chest pain.  Musculoskeletal: Positive for back pain.       Objective:   Physical Exam CONSTITUTIONAL: Well developed/well nourished HEAD: Normocephalic/atraumatic EYES: EOM/PERRL ENMT: Mucous membranes moist, increase in mucopurulent discharge from both nasal passages NECK: supple no meningeal signs SPINE: entire spine nontender CV: S1/S2 noted, no murmurs/rubs/gallops noted LUNGS: Expiratory wheezes. ABDOMEN: soft, nontender, no rebound or guarding GU: no cva tenderness NEURO: Pt is awake/alert, moves all extremitiesx4 EXTREMITIES: pulses normal, full ROM, negative straight leg raising, normal reflexes in the legs SKIN: warm, color normal PSYCH: no abnormalities of mood noted        Assessment & Plan:   By signing my name below, I, Littie Deeds, attest that this documentation has been prepared under the direction and in the presence of Elvina Sidle, MD.  Electronically Signed:  Littie Deeds, Medical Scribe. 02/21/2015. 1:06 PM.  This chart was scribed in my presence and reviewed by me personally.    ICD-9-CM ICD-10-CM   1. Asthma, moderate persistent, with acute exacerbation 493.92 J45.41 albuterol (PROVENTIL HFA;VENTOLIN HFA) 108 (90 BASE) MCG/ACT inhaler     Fluticasone Furoate (ARNUITY ELLIPTA) 200 MCG/ACT AEPB     predniSONE (DELTASONE) 20 MG tablet  2. Chronic maxillary sinusitis 473.0 J32.0 Fluticasone Furoate (ARNUITY ELLIPTA) 200 MCG/ACT AEPB  3. SOB (shortness of breath) 786.05 R06.02 Fluticasone Furoate (ARNUITY ELLIPTA) 200 MCG/ACT AEPB  4. Asthma, unspecified asthma severity, uncomplicated 493.90 J45.909 Fluticasone Furoate (ARNUITY ELLIPTA) 200 MCG/ACT AEPB  5. Acute low back pain 724.2 M54.5 predniSONE (DELTASONE) 20 MG tablet     Signed, Elvina Sidle, MD

## 2015-06-02 ENCOUNTER — Ambulatory Visit (INDEPENDENT_AMBULATORY_CARE_PROVIDER_SITE_OTHER): Payer: BLUE CROSS/BLUE SHIELD | Admitting: Urgent Care

## 2015-06-02 ENCOUNTER — Ambulatory Visit (INDEPENDENT_AMBULATORY_CARE_PROVIDER_SITE_OTHER): Payer: BLUE CROSS/BLUE SHIELD

## 2015-06-02 ENCOUNTER — Ambulatory Visit: Payer: BLUE CROSS/BLUE SHIELD

## 2015-06-02 VITALS — BP 122/68 | HR 72 | Temp 98.0°F | Resp 14 | Ht 72.5 in | Wt 224.6 lb

## 2015-06-02 DIAGNOSIS — M545 Low back pain: Secondary | ICD-10-CM

## 2015-06-02 DIAGNOSIS — M5136 Other intervertebral disc degeneration, lumbar region: Secondary | ICD-10-CM

## 2015-06-02 DIAGNOSIS — R062 Wheezing: Secondary | ICD-10-CM

## 2015-06-02 DIAGNOSIS — J4530 Mild persistent asthma, uncomplicated: Secondary | ICD-10-CM

## 2015-06-02 MED ORDER — ALBUTEROL SULFATE (2.5 MG/3ML) 0.083% IN NEBU: 2.5000 mg | INHALATION_SOLUTION | Freq: Four times a day (QID) | RESPIRATORY_TRACT | Status: DC | PRN

## 2015-06-02 MED ORDER — MELOXICAM 7.5 MG PO TABS: 7.5000 mg | ORAL_TABLET | Freq: Every day | ORAL | Status: DC

## 2015-06-02 MED ORDER — ALBUTEROL SULFATE (2.5 MG/3ML) 0.083% IN NEBU
2.5000 mg | INHALATION_SOLUTION | Freq: Once | RESPIRATORY_TRACT | Status: AC
  Administered 2015-06-02: 2.5 mg via RESPIRATORY_TRACT

## 2015-06-02 MED ORDER — IPRATROPIUM BROMIDE 0.02 % IN SOLN
0.5000 mg | Freq: Once | RESPIRATORY_TRACT | Status: AC
  Administered 2015-06-02: 0.5 mg via RESPIRATORY_TRACT

## 2015-06-02 NOTE — Progress Notes (Signed)
MRN: [id number] DOB: [birth year]  Subjective:   Philip Peters is a 60 y.o. male with pmh of DDD, asthma presenting for chief complaint of Back Pain; Wheezing; and Cough  Back pain - reports several year history of constant lower back pain, has intermittent shooting pain down into his left leg. Pain is sharp in his back, constant, sometimes feels pins and needles. Has a difficult time ambulating, bending. Admits a history of DDD, has undergone physical therapy which Philip Peters thinks did not help. Also had a steroid course 01/2015. Denies incontinence, saddle paresthesia, trauma, back surgeries.  Asthma - reports 3 day history of wheezing, dry cough worse at night, chest tightness. Has started using albuterol inhaler twice a day, also using 3 times a night. Has a nebulizer at home but Philip Peters does not have a mask to use it, medication is expired. Denies fever, sore throat, chest pain, n/v, abdominal pain. Takes Zyrtec as needed and Nasonex every day for allergies. Prior to this episode, patient has used his albuterol inhaler just once a week as needed.  Philip Peters has a current medication list which includes the following prescription(s): albuterol, albuterol, cetirizine, and mometasone. Also has No Known Allergies.  Philip Peters  has a past medical history of Allergy and Asthma. Also  has past surgical history that includes left shoulder surgery and Foreign Body Removal (Right, 05/22/2014).  Objective:   Vitals: BP 122/68 mmHg  Pulse 72  Temp(Src) 98 F (36.7 C) (Oral)  Resp 14  Ht 6' 0.5" (1.842 m)  Wt 224 lb 9.6 oz (101.878 kg)  BMI 30.03 kg/m2  SpO2 96%  PF 550 L/min  Peak flow was 330L/min before and 550L/min after nebulizer treatment. Lungs with mild bibasilar wheezing s/p neb treatment.   Physical Exam  Constitutional: Philip Peters is oriented to person, place, and time. Philip Peters appears well-developed and well-nourished.  HENT:  Mouth/Throat: Oropharynx is clear and moist.  Eyes: No scleral icterus.    Cardiovascular: Normal rate, regular rhythm and intact distal pulses.  Exam reveals no gallop and no friction rub.   No murmur heard. Pulmonary/Chest: No respiratory distress. Philip Peters has wheezes (wheezing and coarse lung sounds throughout). Philip Peters has no rales.  Musculoskeletal:       Lumbar back: Philip Peters exhibits decreased range of motion (bending, extending). Philip Peters exhibits no bony tenderness, no swelling, no edema, no deformity and no spasm.  Negative SLR.  Neurological: Philip Peters is alert and oriented to person, place, and time.  Skin: Skin is warm and dry. No rash noted. No erythema. No pallor.   [facility] reading (PRIMARY) by  Dr. Patsy Lager and PA-Aleshka Corney. Chest - increased markings in right lower base but no definitive acute process Lumbar - stable degenerative changes.  Dg Chest 2 View  06/02/2015  CLINICAL DATA:  60 year old male with cough and wheezing. EXAM: CHEST  2 VIEW COMPARISON:  09/21/2013 and 01/27/2012 radiographs FINDINGS: The cardiomediastinal silhouette is unremarkable. There is no evidence of focal airspace disease, pulmonary edema, suspicious pulmonary nodule/mass, pleural effusion, or pneumothorax. No acute bony abnormalities are identified. IMPRESSION: No active cardiopulmonary disease. Electronically Signed   By: Harmon Pier M.D.   On: 06/02/2015 13:48   Dg Lumbar Spine Complete  06/02/2015  CLINICAL DATA:  Left-sided low back pain for 4 years EXAM: LUMBAR SPINE - COMPLETE 4+ VIEW COMPARISON:  08/01/2014 FINDINGS: Anatomic alignment. No vertebral compression deformity. Mild narrowing of the L5-S1 and L4-5 discs. Mild facet arthropathy at these levels. IMPRESSION: No acute bony pathology. Electronically Signed  By: Jolaine ClickArthur  Hoss M.D.   On: 06/02/2015 13:48   Assessment and Plan :   1. Degenerative disc disease, lumbar 2. Low back pain without sciatica, unspecified back pain laterality - Refer to ortho, start meloxicam as needed for back pain.  3. Extrinsic asthma, mild persistent, uncomplicated 4.  Wheezing - Patient declined steroid course, counseled on need for this if his symptoms fail to improve, consider inhaled steroid.  - Patient agreed to rtc if no improvement.  Philip BambergMario Nathanael Krist, PA-C [facility] [phone] 06/02/2015 12:26 PM

## 2015-06-02 NOTE — Patient Instructions (Signed)
Asthma, Adult Asthma is a recurring condition in which the airways tighten and narrow. Asthma can make it difficult to breathe. It can cause coughing, wheezing, and shortness of breath. Asthma episodes, also called asthma attacks, range from minor to life-threatening. Asthma cannot be cured, but medicines and lifestyle changes can help control it. CAUSES Asthma is believed to be caused by inherited (genetic) and environmental factors, but its exact cause is unknown. Asthma may be triggered by allergens, lung infections, or irritants in the air. Asthma triggers are different for each person. Common triggers include:   Animal dander.  Dust mites.  Cockroaches.  Pollen from trees or grass.  Mold.  Smoke.  Air pollutants such as dust, household cleaners, hair sprays, aerosol sprays, paint fumes, strong chemicals, or strong odors.  Cold air, weather changes, and winds (which increase molds and pollens in the air).  Strong emotional expressions such as crying or laughing hard.  Stress.  Certain medicines (such as aspirin) or types of drugs (such as beta-blockers).  Sulfites in foods and drinks. Foods and drinks that may contain sulfites include dried fruit, potato chips, and sparkling grape juice.  Infections or inflammatory conditions such as the flu, a cold, or an inflammation of the nasal membranes (rhinitis).  Gastroesophageal reflux disease (GERD).  Exercise or strenuous activity. SYMPTOMS Symptoms may occur immediately after asthma is triggered or many hours later. Symptoms include:  Wheezing.  Excessive nighttime or early morning coughing.  Frequent or severe coughing with a common cold.  Chest tightness.  Shortness of breath. DIAGNOSIS  The diagnosis of asthma is made by a review of your medical history and a physical exam. Tests may also be performed. These may include:  Lung function studies. These tests show how much air you breathe in and out.  Allergy  tests.  Imaging tests such as X-rays. TREATMENT  Asthma cannot be cured, but it can usually be controlled. Treatment involves identifying and avoiding your asthma triggers. It also involves medicines. There are 2 classes of medicine used for asthma treatment:   Controller medicines. These prevent asthma symptoms from occurring. They are usually taken every day.  Reliever or rescue medicines. These quickly relieve asthma symptoms. They are used as needed and provide short-term relief. Your health care provider will help you create an asthma action plan. An asthma action plan is a written plan for managing and treating your asthma attacks. It includes a list of your asthma triggers and how they may be avoided. It also includes information on when medicines should be taken and when their dosage should be changed. An action plan may also involve the use of a device called a peak flow meter. A peak flow meter measures how well the lungs are working. It helps you monitor your condition. HOME CARE INSTRUCTIONS   Take medicines only as directed by your health care provider. Speak with your health care provider if you have questions about how or when to take the medicines.  Use a peak flow meter as directed by your health care provider. Record and keep track of readings.  Understand and use the action plan to help minimize or stop an asthma attack without needing to seek medical care.  Control your home environment in the following ways to help prevent asthma attacks:  Do not smoke. Avoid being exposed to secondhand smoke.  Change your heating and air conditioning filter regularly.  Limit your use of fireplaces and wood stoves.  Get rid of pests (such as roaches   and mice) and their droppings.  Throw away plants if you see mold on them.  Clean your floors and dust regularly. Use unscented cleaning products.  Try to have someone else vacuum for you regularly. Stay out of rooms while they are  being vacuumed and for a short while afterward. If you vacuum, use a dust mask from a hardware store, a double-layered or microfilter vacuum cleaner bag, or a vacuum cleaner with a HEPA filter.  Replace carpet with wood, tile, or vinyl flooring. Carpet can trap dander and dust.  Use allergy-proof pillows, mattress covers, and box spring covers.  Wash bed sheets and blankets every week in hot water and dry them in a dryer.  Use blankets that are made of polyester or cotton.  Clean bathrooms and kitchens with bleach. If possible, have someone repaint the walls in these rooms with mold-resistant paint. Keep out of the rooms that are being cleaned and painted.  Wash hands frequently. SEEK MEDICAL CARE IF:   You have wheezing, shortness of breath, or a cough even if taking medicine to prevent attacks.  The colored mucus you cough up (sputum) is thicker than usual.  Your sputum changes from clear or white to yellow, green, gray, or bloody.  You have any problems that may be related to the medicines you are taking (such as a rash, itching, swelling, or trouble breathing).  You are using a reliever medicine more than 2-3 times per week.  Your peak flow is still at 50-79% of your personal best after following your action plan for 1 hour.  You have a fever. SEEK IMMEDIATE MEDICAL CARE IF:   You seem to be getting worse and are unresponsive to treatment during an asthma attack.  You are short of breath even at rest.  You get short of breath when doing very little physical activity.  You have difficulty eating, drinking, or talking due to asthma symptoms.  You develop chest pain.  You develop a fast heartbeat.  You have a bluish color to your lips or fingernails.  You are light-headed, dizzy, or faint.  Your peak flow is less than 50% of your personal best.   This information is not intended to replace advice given to you by your health care provider. Make sure you discuss any  questions you have with your health care provider.   Document Released: 05/17/2005 Document Revised: 02/05/2015 Document Reviewed: 12/14/2012 Elsevier Interactive Patient Education 2016 [employer]    Degenerative Disk Disease Degenerative disk disease is a condition caused by the changes that occur in spinal disks as you grow older. Spinal disks are soft and compressible disks located between the bones of your spine (vertebrae). These disks act like shock absorbers. Degenerative disk disease can affect the whole spine. However, the neck and lower back are most commonly affected. Many changes can occur in the spinal disks with aging, such as:  The spinal disks may dry and shrink.  Small tears may occur in the tough, outer covering of the disk (annulus).  The disk space may become smaller due to loss of water.  Abnormal growths in the bone (spurs) may occur. This can put pressure on the nerve roots exiting the spinal canal, causing pain.  The spinal canal may become narrowed. RISK FACTORS   Being overweight.  Having a family history of degenerative disk disease.  Smoking.  There is increased risk if you are doing heavy lifting or have a sudden injury. SIGNS AND SYMPTOMS  Symptoms vary   from person to person and may include:  Pain that varies in intensity. Some people have no pain, while others have severe pain. The location of the pain depends on the part of your backbone that is affected.  You will have neck or arm pain if a disk in the neck area is affected.  You will have pain in your back, buttocks, or legs if a disk in the lower back is affected.  Pain that becomes worse while bending, reaching up, or with twisting movements.  Pain that may start gradually and then get worse as time passes. It may also start after a major or minor injury.  Numbness or tingling in the arms or legs. DIAGNOSIS  Your health care provider will ask you about your symptoms and about  activities or habits that may cause the pain. He or she may also ask about any injuries, diseases, or treatments you have had. Your health care provider will examine you to check for the range of movement that is possible in the affected area, to check for strength in your extremities, and to check for sensation in the areas of the arms and legs supplied by different nerve roots. You may also have:   An X-ray of the spine.  Other imaging tests, such as MRI. TREATMENT  Your health care provider will advise you on the best plan for treatment. Treatment may include:  Medicines.  Rehabilitation exercises. HOME CARE INSTRUCTIONS   Follow proper lifting and walking techniques as advised by your health care provider.  Maintain good posture.  Exercise regularly as advised by your health care provider.  Perform relaxation exercises.  Change your sitting, standing, and sleeping habits as advised by your health care provider.  Change positions frequently.  Lose weight or maintain a healthy weight as advised by your health care provider.  Do not use any tobacco products, including cigarettes, chewing tobacco, or electronic cigarettes. If you need help quitting, ask your health care provider.  Wear supportive footwear.  Take medicines only as directed by your health care provider. SEEK MEDICAL CARE IF:   Your pain does not go away within 1-4 weeks.  You have significant appetite or weight loss. SEEK IMMEDIATE MEDICAL CARE IF:   Your pain is severe.  You notice weakness in your arms, hands, or legs.  You begin to lose control of your bladder or bowel movements.  You have fevers or night sweats. MAKE SURE YOU:   Understand these instructions.  Will watch your condition.  Will get help right away if you are not doing well or get worse.   This information is not intended to replace advice given to you by your health care provider. Make sure you discuss any questions you have with  your health care provider.   Document Released: 03/14/2007 Document Revised: 06/07/2014 Document Reviewed: 09/18/2013 Elsevier Interactive Patient Education 2016 Elsevier Inc.  

## 2015-06-06 ENCOUNTER — Ambulatory Visit (INDEPENDENT_AMBULATORY_CARE_PROVIDER_SITE_OTHER): Payer: BLUE CROSS/BLUE SHIELD | Admitting: Physician Assistant

## 2015-06-06 VITALS — BP 126/80 | HR 74 | Temp 97.5°F | Resp 20 | Ht 77.0 in | Wt 225.6 lb

## 2015-06-06 DIAGNOSIS — J4540 Moderate persistent asthma, uncomplicated: Secondary | ICD-10-CM | POA: Diagnosis not present

## 2015-06-06 DIAGNOSIS — M5136 Other intervertebral disc degeneration, lumbar region: Secondary | ICD-10-CM | POA: Diagnosis not present

## 2015-06-06 DIAGNOSIS — R0602 Shortness of breath: Secondary | ICD-10-CM | POA: Diagnosis not present

## 2015-06-06 DIAGNOSIS — J209 Acute bronchitis, unspecified: Secondary | ICD-10-CM | POA: Diagnosis not present

## 2015-06-06 MED ORDER — METHOCARBAMOL 500 MG PO TABS: 500.0000 mg | ORAL_TABLET | Freq: Two times a day (BID) | ORAL | Status: DC | PRN

## 2015-06-06 MED ORDER — ALBUTEROL SULFATE (2.5 MG/3ML) 0.083% IN NEBU
2.5000 mg | INHALATION_SOLUTION | Freq: Once | RESPIRATORY_TRACT | Status: AC
  Administered 2015-06-06: 2.5 mg via RESPIRATORY_TRACT

## 2015-06-06 MED ORDER — MONTELUKAST SODIUM 10 MG PO TABS: 10.0000 mg | ORAL_TABLET | Freq: Every day | ORAL | Status: DC

## 2015-06-06 MED ORDER — GUAIFENESIN ER 1200 MG PO TB12: 1.0000 | ORAL_TABLET | Freq: Two times a day (BID) | ORAL | Status: AC

## 2015-06-06 MED ORDER — HYDROCOD POLST-CPM POLST ER 10-8 MG/5ML PO SUER: 5.0000 mL | Freq: Two times a day (BID) | ORAL | Status: DC | PRN

## 2015-06-06 MED ORDER — DOXYCYCLINE HYCLATE 100 MG PO TABS: 100.0000 mg | ORAL_TABLET | Freq: Two times a day (BID) | ORAL | Status: DC

## 2015-06-06 NOTE — Progress Notes (Signed)
Philip Peters  MRN: [id number] DOB: July 01, 1955  Subjective:  Pt presents to clinic for a recheck of his cold symptoms  He has continued coughing since his visit a few days ago and he still feel SOB and he has been using his albuterol neb about once a day when he feels the worse SOB and wheezing.  Coughing is keeping his wife awake at night.  He uses his nose spray once spray a day because 2 sprays makes him feel lightheaded.  He has daily headache with his back pain and neck muscle spasms - the headaches are occipital in origin and do not stop him from doing anything he wants to do - they are more annoying than anything. - He would like his referral to be changed to [facility] and not [facility] because of a balance.  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  He has never seen a pulmonologist but he has been on multiple asthma medications.  He has a remote history of smoking but stopped >10 years ago.  His mother had COPD.  Patient Active Problem List   Diagnosis Date Noted  . Asthma, moderate persistent 08/01/2014  . DDD (degenerative disc disease), lumbar 08/01/2014    Current Outpatient Prescriptions on File Prior to Visit  Medication Sig Dispense Refill  . albuterol (PROVENTIL HFA;VENTOLIN HFA) 108 (90 BASE) MCG/ACT inhaler Inhale 2 puffs into the lungs every 4 (four) hours as needed for wheezing (cough, shortness of breath or wheezing.). 1 Inhaler 12  . albuterol (PROVENTIL) (2.5 MG/3ML) 0.083% nebulizer solution Take 3 mLs (2.5 mg total) by nebulization every 6 (six) hours as needed for wheezing or shortness of breath. 150 mL 11  . cetirizine (ZYRTEC) 10 MG tablet Take 1 tablet (10 mg total) by mouth daily. 30 tablet 11  . meloxicam (MOBIC) 7.5 MG tablet Take 1 tablet (7.5 mg total) by mouth daily. 30 tablet 2  . mometasone (NASONEX) 50 MCG/ACT nasal spray Place 4 sprays into the nose daily. 17 g 12   No current facility-administered medications on file prior to visit.     No Known Allergies  Review of Systems  Constitutional: Negative for fever and chills.  HENT: Positive for congestion and postnasal drip. Negative for rhinorrhea and sore throat.   Respiratory: Positive for cough (rare - yellow thick), shortness of breath and wheezing.   Gastrointestinal: Negative for nausea, vomiting and diarrhea.  Musculoskeletal: Positive for neck pain (muscle related).  Neurological: Positive for headaches.   Objective:  BP 126/80 mmHg  Pulse 74  Temp(Src) 97.5 F (36.4 C) (Oral)  Resp 20  Ht 6\' 5"  (1.956 m)  Wt 225 lb 9.6 oz (102.331 kg)  BMI 26.75 kg/m2  SpO2 96%  Physical Exam  Constitutional: He is oriented to person, place, and time and well-developed, well-nourished, and in no distress.     HENT:  Head: Normocephalic and atraumatic.  Right Ear: Hearing, tympanic membrane, external ear and ear canal normal.  Left Ear: Hearing, tympanic membrane, external ear and ear canal normal.  Nose: Mucosal edema (pale and very swollen) and rhinorrhea (clear) present.  Mouth/Throat: Uvula is midline, oropharynx is clear and moist and mucous membranes are normal.  Eyes: Conjunctivae are normal.  Neck: Normal range of motion.  Cardiovascular: Normal rate, regular rhythm and normal heart sounds.   Pulmonary/Chest: Effort normal. He has wheezes (expiratory all lung fields).  Pt given albuterol neb and the wheezing improved to only end expiratory wheezing  Lymphadenopathy:  Head (right side): No tonsillar adenopathy present.       Head (left side): No tonsillar adenopathy present.    He has no cervical adenopathy.       Right: No supraclavicular adenopathy present.       Left: No supraclavicular adenopathy present.  Neurological: He is alert and oriented to person, place, and time. Gait normal.  Skin: Skin is warm and dry.  Psychiatric: Mood, memory, affect and judgment normal.    Assessment and Plan :  SOB (shortness of breath) - Plan: albuterol  (PROVENTIL) (2.5 MG/3ML) 0.083% nebulizer solution 2.5 mg, Care order/instruction - pt has a cold which I believe is causing an asthma flair - I am also not sure he has good asthma control because he states that he feels like he has to use his albuterol most days -  Asthma, moderate persistent, uncomplicated - Plan: chlorpheniramine-HYDROcodone (TUSSIONEX PENNKINETIC ER) 10-8 MG/5ML SUER, montelukast (SINGULAIR) 10 MG tablet - we are going to add singulair and change his nasonex to bid in hopes that controlling his allergies will help control his asthma - though I think that it is possible that he patient may have COPD which would explain why he is not improving - he will recheck with me in about 4-6 week and if he is no better we will do a pulmonology referral.  DDD (degenerative disc disease), lumbar - Plan: methocarbamol (ROBAXIN) 500 MG tablet, AMB referral to orthopedics - changed referral for patient - he has been on flexeril in the past which he stated did not help him but we will try robaxin because he does not believe he has been on that in the past  Acute bronchitis, unspecified organism - Plan: doxycycline (VIBRA-TABS) 100 MG tablet, Guaifenesin (MUCINEX MAXIMUM STRENGTH) 1200 MG TB12 - this will cover his chest and his sinuses   Benny Lennert PA-C  [facility] 06/06/2015 4:03 PM

## 2015-07-02 ENCOUNTER — Other Ambulatory Visit: Payer: Self-pay | Admitting: Orthopedic Surgery

## 2015-07-02 DIAGNOSIS — M545 Low back pain: Secondary | ICD-10-CM

## 2015-07-04 ENCOUNTER — Ambulatory Visit (INDEPENDENT_AMBULATORY_CARE_PROVIDER_SITE_OTHER): Payer: BLUE CROSS/BLUE SHIELD | Admitting: Family Medicine

## 2015-07-04 ENCOUNTER — Ambulatory Visit (INDEPENDENT_AMBULATORY_CARE_PROVIDER_SITE_OTHER): Payer: BLUE CROSS/BLUE SHIELD

## 2015-07-04 VITALS — BP 130/76 | HR 76 | Temp 98.1°F | Resp 17 | Ht 73.0 in | Wt 228.0 lb

## 2015-07-04 DIAGNOSIS — M5382 Other specified dorsopathies, cervical region: Secondary | ICD-10-CM | POA: Diagnosis not present

## 2015-07-04 DIAGNOSIS — Z23 Encounter for immunization: Secondary | ICD-10-CM

## 2015-07-04 DIAGNOSIS — M629 Disorder of muscle, unspecified: Secondary | ICD-10-CM

## 2015-07-04 DIAGNOSIS — M25511 Pain in right shoulder: Secondary | ICD-10-CM | POA: Diagnosis not present

## 2015-07-04 MED ORDER — METHOCARBAMOL 500 MG PO TABS: ORAL_TABLET | ORAL | Status: DC

## 2015-07-04 MED ORDER — HYDROCODONE-ACETAMINOPHEN 5-325 MG PO TABS: 1.0000 | ORAL_TABLET | Freq: Four times a day (QID) | ORAL | Status: DC | PRN

## 2015-07-04 MED ORDER — DICLOFENAC SODIUM 75 MG PO TBEC: DELAYED_RELEASE_TABLET | ORAL | Status: DC

## 2015-07-04 NOTE — Progress Notes (Signed)
Patient ID: Philip Peters, male    DOB: [birth year]  Age: 60 y.o. MRN: [id number]  Chief Complaint  Patient presents with  . Shoulder Injury  . Shortness of Breath    Subjective:   60 year old man who is been having a lot of trouble with pain in his right shoulder over the last couple of weeks. It hurts when he is active but also when he is inactive. Been progressively worsening pain. Did not have any specific injury. He works regularly at [employer]. He has had prior problems with left shoulder and has had shoulder surgery on the left. He also has been having problems with pain in his back, is in the process of workup for that with an MRI pending. He has taken meloxicam, didn't get much in the way of relief with that.  Current allergies, medications, problem list, past/family and social histories reviewed.  Objective:  BP 130/76 mmHg  Pulse 76  Temp(Src) 98.1 F (36.7 C) (Oral)  Resp 17  Ht  (1.854 m)  Wt 228 lb (103.42 kg)  BMI 30.09 kg/m2  SpO2 98%  No major acute distress. Has weakness of the right shoulder on abduction especially. He is tender in the right trapezius. Decreased range of motion of the neck, especially on side-to-side tilt. Grip is less and then the right hand.  Assessment & Plan:   Assessment: 1. Right shoulder pain   2. Musculoskeletal disorder involving upper trapezius muscle   3. Needs flu shot       Plan: We'll get a cervical spine and shoulder x-ray and go from there.  Orders Placed This Encounter  Procedures  . DG Cervical Spine Complete    Order Specific Question:  Reason for Exam (SYMPTOM  OR DIAGNOSIS REQUIRED)    Answer:  pain    Order Specific Question:  Preferred imaging location?    Answer:  External  . DG Shoulder Left    Order Specific Question:  Reason for Exam (SYMPTOM  OR DIAGNOSIS REQUIRED)    Answer:  pain    Order Specific Question:  Preferred imaging location?    Answer:  External  . Flu Vaccine QUAD 36+ mos IM     Meds ordered this encounter  Medications  . HYDROcodone-acetaminophen (NORCO) 5-325 MG tablet    Sig: Take 1 tablet by mouth every 6 (six) hours as needed.    Dispense:  20 tablet    Refill:  0  . diclofenac (VOLTAREN) 75 MG EC tablet    Sig: Take one twice daily with meals for pain and inflammation    Dispense:  30 tablet    Refill:  0  . methocarbamol (ROBAXIN) 500 MG tablet    Sig: Take 1 pill 3 times daily and 2 at bedtime for muscle relaxant    Dispense:  60 tablet    Refill:  0         Patient Instructions   Take the methocarbamol 500 mg 1 pill 3 times daily with meals and 2 at bedtime for muscle relaxant. You may have some of this left over, but this is for more frequent dosing. It is best in function if you take it regularly.  Take the pain pills only for severe pain, hydrocodone. Primarily use at bedtime.  Take the diclofenac one twice daily for pain and inflammation. Do not take the meloxicam while you are taking this.  Return as needed  NOTE: Because you received an x-ray today, you will receive  an Economist from [facility]. Please contact [facility] at [phone] with questions or concerns regarding your invoice. Our billing staff will not be able to assist you with those questions.      Return if symptoms worsen or fail to improve.   Kenyana Husak, MD 07/04/2015

## 2015-07-04 NOTE — Patient Instructions (Addendum)
  Take the methocarbamol 500 mg 1 pill 3 times daily with meals and 2 at bedtime for muscle relaxant. You may have some of this left over, but this is for more frequent dosing. It is best in function if you take it regularly.  Take the pain pills only for severe pain, hydrocodone. Primarily use at bedtime.  Take the diclofenac one twice daily for pain and inflammation. Do not take the meloxicam while you are taking this.  Return as needed  NOTE: Because you received an x-ray today, you will receive an invoice from [facility]. Please contact [facility] at [phone] with questions or concerns regarding your invoice. Our billing staff will not be able to assist you with those questions.

## 2015-07-11 ENCOUNTER — Ambulatory Visit
Admission: RE | Admit: 2015-07-11 | Discharge: 2015-07-11 | Disposition: A | Discharge status: Home / Self Care | Payer: BLUE CROSS/BLUE SHIELD | Source: Ambulatory Visit | Attending: Orthopedic Surgery | Admitting: Orthopedic Surgery

## 2015-07-11 DIAGNOSIS — M545 Low back pain: Secondary | ICD-10-CM

## 2015-09-11 ENCOUNTER — Encounter: Payer: Self-pay | Admitting: Family Medicine

## 2015-09-11 NOTE — Progress Notes (Signed)
A [facility] was referred to me. It was sent to Dr. Perrin MalteseGuest. The patient needs a preoperative medical evaluation for lumbar fusion. We will ask the patient to come in for this exam.  The form is in my box.  [name] M.D.

## 2015-09-12 NOTE — Progress Notes (Signed)
Called pt, unable to leave message.

## 2015-09-16 ENCOUNTER — Ambulatory Visit (INDEPENDENT_AMBULATORY_CARE_PROVIDER_SITE_OTHER): Payer: BLUE CROSS/BLUE SHIELD | Admitting: Family Medicine

## 2015-09-16 ENCOUNTER — Ambulatory Visit (INDEPENDENT_AMBULATORY_CARE_PROVIDER_SITE_OTHER): Payer: BLUE CROSS/BLUE SHIELD

## 2015-09-16 VITALS — BP 136/82 | HR 90 | Temp 97.0°F | Resp 18 | Wt 224.0 lb

## 2015-09-16 DIAGNOSIS — R0602 Shortness of breath: Secondary | ICD-10-CM | POA: Diagnosis not present

## 2015-09-16 DIAGNOSIS — M545 Low back pain: Secondary | ICD-10-CM

## 2015-09-16 DIAGNOSIS — J4531 Mild persistent asthma with (acute) exacerbation: Secondary | ICD-10-CM

## 2015-09-16 DIAGNOSIS — Z01810 Encounter for preprocedural cardiovascular examination: Secondary | ICD-10-CM

## 2015-09-16 DIAGNOSIS — M5136 Other intervertebral disc degeneration, lumbar region: Secondary | ICD-10-CM

## 2015-09-16 DIAGNOSIS — M47816 Spondylosis without myelopathy or radiculopathy, lumbar region: Secondary | ICD-10-CM

## 2015-09-16 LAB — POCT CBC
Granulocyte percent: 45.8 %G (ref 37–80)
HCT, POC: 44.3 % (ref 43.5–53.7)
HEMOGLOBIN: 15.6 g/dL (ref 14.1–18.1)
LYMPH, POC: 2.1 (ref 0.6–3.4)
MCH: 30.5 pg (ref 27–31.2)
MCHC: 35.2 g/dL (ref 31.8–35.4)
MCV: 86.7 fL (ref 80–97)
MID (cbc): 0.5 (ref 0–0.9)
MPV: 7.7 fL (ref 0–99.8)
PLATELET COUNT, POC: 175 10*3/uL (ref 142–424)
POC Granulocyte: 2.2 (ref 2–6.9)
POC LYMPH PERCENT: 44.5 %L (ref 10–50)
POC MID %: 9.7 %M (ref 0–12)
RBC: 5.11 M/uL (ref 4.69–6.13)
RDW, POC: 13.3 %
WBC: 4.8 10*3/uL (ref 4.6–10.2)

## 2015-09-16 NOTE — Patient Instructions (Addendum)
Drink plenty of fluids  Try to avoid pollen the best you can  Continue your current breathing medications  Take prednisone 20 mg 3 pills daily for 2 days, then 2 daily for 2 days, then 1 daily for 2 days  Use the nebulizer machine in place of the albuterol inhaler as needed  Also begin Singulair 1 pill daily at bedtime  Return in 2 weeks for a recheck, sooner if needed    IF you received an x-ray today, you will receive an invoice from [facility]. Please contact [facility] at [phone] with questions or concerns regarding your invoice.   IF you received labwork today, you will receive an invoice from [facility]. Please contact [facility] at [phone] with questions or concerns regarding your invoice.   Our billing staff will not be able to assist you with questions regarding bills from these companies.  You will be contacted with the lab results as soon as they are available. The fastest way to get your results is to activate your My Chart account. Instructions are located on the last page of this paperwork. If you have not heard from us regarding the results in 2 weeks, please contact this office.

## 2015-09-16 NOTE — Progress Notes (Signed)
Preoperative medical evaluation.  History: Patient is here for a preoperative medical evaluation. A referral form was obtained from Dr. Vira BrownsJames Nitka for medical clearance. That will be scanned into the medical record. He has had previous surgery on his shoulder a few years ago and did okay with anesthesia.  Patient currently is having an acute flare of his asthma, brought on by the pollens.  Past medical history: Medical illnesses: History of asthma intermittently. Uses chronic an ablation therapy. Surgeries: Left shoulder surgery Medications: As listed Allergies: No known drug allergies  Family history: Parents are deceased. No history of anesthesia reactions known in the family  Social history: Patient is married, has 3 children and 2 stepchildren. He works at a [employer] company. He used to exercise until his back out bothering him badly.  Review of systems Constitutional: Unremarkable HEENT: Unremarkable Cardiovascular: Unremarkable Respiratory: Intermittent episodes of wheezing, currently exacerbated. He gets some chest discomfort anteriorly when he is coughing too much, but no other exertional or resting chest pains. Gastrointestinal: Unremarkable Genitourinary: Unremarkable except for nocturia of up to about 4 times. He does drink a lot of coffee in the evenings. Musculoskeletal: Left shoulder pain with area of tenderness still since his surgery. He has left lumbar radiculopathy from his back. Dermatologic: Unremarkable Endocrinologic: Unremarkable Psychiatric: Unremarkable Neurologic: Radicular pain, otherwise unremarkable  Physical exam: Healthy-appearing gentleman in no acute distress right now. He does cough a little bit. His TMs are normal. Eyes PERRLA. Throat clear. Neck supple without nodes. He is very ticklish and difficult to examine. Chest has scattered rhonchi and wheezes bilaterally. Heart regular without murmurs. Abdomen soft without mass or tenderness. No axillary  or inguinal nodes. Extremities unremarkable without edema.  Assessment: Preoperative medical clearance, will postpone any surgeries until his lungs are doing better Asthma Lumbar disc disease with radiculopathy Facet syndrome of lumbar spine  Plan: Chest x-ray and EKG and preoperative labs  Chest x-ray appears normal. Radiology reading is still pending  EKG normal  Results for orders placed or performed in visit on 09/16/15  POCT CBC  Result Value Ref Range   WBC 4.8 4.6 - 10.2 K/uL   Lymph, poc 2.1 0.6 - 3.4   POC LYMPH PERCENT 44.5 10 - 50 %L   MID (cbc) 0.5 0 - 0.9   POC MID % 9.7 0 - 12 %M   POC Granulocyte 2.2 2 - 6.9   Granulocyte percent 45.8 37 - 80 %G   RBC 5.11 4.69 - 6.13 M/uL   Hemoglobin 15.6 14.1 - 18.1 g/dL   HCT, POC 16.144.3 09.643.5 - 53.7 %   MCV 86.7 80 - 97 fL   MCH, POC 30.5 27 - 31.2 pg   MCHC 35.2 31.8 - 35.4 g/dL   RDW, POC 04.513.3 %   Platelet Count, POC 175 142 - 424 K/uL   MPV 7.7 0 - 99.8 fL

## 2015-09-16 NOTE — Progress Notes (Signed)
Pt advised.

## 2015-09-16 NOTE — Progress Notes (Signed)
Patient ID: Philip Peters, male    DOB: 09-Jul-1955  Age: 60 y.o. MRN: [id number]  Chief Complaint  Patient presents with  . Cough  . Asthma    Subjective:   Patient had a lot of wheezing came on badly yesterday. He was short of breath last night. That is why he came on in today. He also needed preop medical clearance. He has a long history of asthma. Seasonal pollens cause it to worsen. A couple of weeks ago he had a bad episode a work where he has to go outside and get some fresh air. 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 He does not smoke. He uses to respiratory and one nasal inhaler. He does well most of the time.  Current allergies, medications, problem list, past/family and social histories reviewed.  Objective:  BP 136/82 mmHg  Pulse 90  Temp(Src) 97 F (36.1 C) (Oral)  Resp 18  Wt 224 lb (101.606 kg)  SpO2 96%  PF 550 L/min  No major distress. Throat clear. Neck supple without nodes or thyromegaly. Chest has scattered rhonchi and wheezes bilaterally. Heart regular without murmurs. Abdomen soft and nontender.  Assessment & Plan:   Assessment: 1. Asthma, mild persistent, with acute exacerbation   2. Pre-operative cardiovascular examination   3. Facet syndrome, lumbar   4. Degenerative disc disease, lumbar   5. Shortness of breath       Plan: Will evaluate the asthma with chest x-ray and EKG and will need to give him a taper dose of prednisone for this.  See the separate note for his preoperative medical clearance  Orders Placed This Encounter  Procedures  . DG Chest 2 View    Order Specific Question:  Reason for Exam (SYMPTOM  OR DIAGNOSIS REQUIRED)    Answer:  asthma    Order Specific Question:  Preferred imaging location?    Answer:  External  . Comprehensive metabolic panel  . POCT CBC  . EKG 12-Lead    No orders of the defined types were placed in this encounter.          Patient Instructions   Drink plenty of fluids  Try to avoid pollen the best you can  Continue your current breathing medications  Take prednisone 20 mg 3 pills daily for 2 days, then 2 daily for 2 days, then 1 daily for 2 days  Use the nebulizer machine in place of the albuterol inhaler as needed  Also begin Singulair 1 pill daily at bedtime  Return in 2 weeks for a recheck, sooner if needed    IF you received an x-ray today, you will receive an invoice from [facility]. Please contact [facility] at [phone] with questions or concerns regarding your invoice.   IF you received labwork today, you will receive an invoice from [facility]. Please contact [facility] at [phone] with questions or concerns regarding your invoice.   Our billing staff will not be able to assist you with questions regarding bills from these companies.  You will be contacted with the lab results as soon as they are available. The fastest way to get your results is to activate your My Chart account. Instructions are located on the last page of this paperwork. If you have not heard from [place] regarding the results in 2 weeks, please contact this office.  No Follow-up on file.   HOPPER,DAVID, MD 09/16/2015

## 2015-09-17 ENCOUNTER — Telehealth: Payer: Self-pay

## 2015-09-17 LAB — COMPREHENSIVE METABOLIC PANEL
ALBUMIN: 4.3 g/dL (ref 3.6–5.1)
ALT: 31 U/L (ref 9–46)
AST: 18 U/L (ref 10–35)
Alkaline Phosphatase: 71 U/L (ref 40–115)
BILIRUBIN TOTAL: 0.5 mg/dL (ref 0.2–1.2)
BUN: 17 mg/dL (ref 7–25)
CALCIUM: 9 mg/dL (ref 8.6–10.3)
CHLORIDE: 107 mmol/L (ref 98–110)
CO2: 23 mmol/L (ref 20–31)
Creat: 1.24 mg/dL (ref 0.70–1.25)
GLUCOSE: 95 mg/dL (ref 65–99)
POTASSIUM: 4 mmol/L (ref 3.5–5.3)
Sodium: 143 mmol/L (ref 135–146)
Total Protein: 7.2 g/dL (ref 6.1–8.1)

## 2015-09-17 MED ORDER — PREDNISONE 20 MG PO TABS: ORAL_TABLET | ORAL | Status: DC

## 2015-09-17 NOTE — Telephone Encounter (Signed)
Take prednisone 20 mg 3 pills daily for 2 days, then 2 daily for 2 days, then 1 daily for 2 days

## 2015-09-17 NOTE — Telephone Encounter (Signed)
Patient was seen yesterday was told that we would send prednisone to the pharmacy. The medication was never sent.  Patient phone: [phone]

## 2015-09-17 NOTE — Telephone Encounter (Signed)
Rx sent in. Called pt to advise. Left VM.

## 2015-09-29 ENCOUNTER — Ambulatory Visit (INDEPENDENT_AMBULATORY_CARE_PROVIDER_SITE_OTHER): Payer: BLUE CROSS/BLUE SHIELD | Admitting: Family Medicine

## 2015-09-29 VITALS — BP 128/88 | HR 71 | Temp 98.2°F | Resp 18

## 2015-09-29 DIAGNOSIS — J329 Chronic sinusitis, unspecified: Secondary | ICD-10-CM | POA: Diagnosis not present

## 2015-09-29 DIAGNOSIS — J4541 Moderate persistent asthma with (acute) exacerbation: Secondary | ICD-10-CM

## 2015-09-29 MED ORDER — MONTELUKAST SODIUM 10 MG PO TABS
10.0000 mg | ORAL_TABLET | Freq: Every day | ORAL | Status: DC
Start: 2015-09-29 — End: 2017-10-23

## 2015-09-29 MED ORDER — ALBUTEROL SULFATE (2.5 MG/3ML) 0.083% IN NEBU
2.5000 mg | INHALATION_SOLUTION | Freq: Once | RESPIRATORY_TRACT | Status: AC
  Administered 2015-09-29: 2.5 mg via RESPIRATORY_TRACT

## 2015-09-29 MED ORDER — AMOXICILLIN-POT CLAVULANATE 875-125 MG PO TABS
1.0000 | ORAL_TABLET | Freq: Two times a day (BID) | ORAL | Status: DC
Start: 2015-09-29 — End: 2015-10-15

## 2015-09-29 MED ORDER — FLUTICASONE FUROATE 200 MCG/ACT IN AEPB: 1.0000 | INHALATION_SPRAY | Freq: Once | RESPIRATORY_TRACT | Status: DC

## 2015-09-29 MED ORDER — PREDNISONE 20 MG PO TABS: ORAL_TABLET | ORAL | Status: DC

## 2015-09-29 MED ORDER — IPRATROPIUM BROMIDE 0.02 % IN SOLN
0.5000 mg | Freq: Once | RESPIRATORY_TRACT | Status: AC
  Administered 2015-09-29: 0.5 mg via RESPIRATORY_TRACT

## 2015-09-29 NOTE — Progress Notes (Signed)
Patient ID: Philip Peters, male    DOB: March 30, 1956  Age: 60 y.o. MRN: [id number]  Chief Complaint  Patient presents with  . Follow-up    cough, wheezing, congestion    Subjective:   60 year old man who is here with coughing and wheezing and congestion. He was treated several weeks ago with a round of steroids. He has been using his inhaler regularly as often as allowed and uses his albuterol nebulizer at home at times. He has continued to stay very congested. He feels like he has some discomfort in his right maxillary region of the face in the right side of the nose stays stuffy. He has been coughing up some mucus at times, but most of the time it is dry. He is wheezing and coughing. He has trouble sleeping at night due to the symptoms.   Current allergies, medications, problem list, past/family and social histories reviewed.  Objective:  BP 128/88 mmHg  Pulse 71  Temp(Src) 98.2 F (36.8 C)  Resp 18  SpO2 97%  PF 500 L/min  No major distress but is coughing. His TMs are normal. Throat clear. Neck supple without significant nodes. Chest is wheezing bilaterally with scattered rhonchi throughout his lungs. Heart regular without murmurs. Peak flow was 500 with a predicted maximum of 550 for his age and height.  Assessment & Plan:   Assessment: 1. Asthma with acute exacerbation, moderate persistent   2. Chronic sinusitis, unspecified location       Plan: Will give a nebulizer treatment initially and see what it sounds like and then decide to do imaging studies.  No orders of the defined types were placed in this encounter.    Meds ordered this encounter  Medications  . albuterol (PROVENTIL) (2.5 MG/3ML) 0.083% nebulizer solution 2.5 mg    Sig:   . ipratropium (ATROVENT) nebulizer solution 0.5 mg    Sig:   . predniSONE (DELTASONE) 20 MG tablet    Sig: Take 3 pills for 2 days, 2 pills for 2 days, and 1 pill for 2 days    Dispense:  12 tablet    Refill:  0  . montelukast  (SINGULAIR) 10 MG tablet    Sig: Take 1 tablet (10 mg total) by mouth at bedtime.    Dispense:  30 tablet    Refill:  5  . Fluticasone Furoate (ARNUITY ELLIPTA) 200 MCG/ACT AEPB    Sig: Inhale 1 puff into the lungs once.    Dispense:  30 each    Refill:  5  . amoxicillin-clavulanate (AUGMENTIN) 875-125 MG tablet    Sig: Take 1 tablet by mouth 2 (two) times daily.    Dispense:  20 tablet    Refill:  0         Patient Instructions   Continue using the Arnuity Elliptica inhaler daily  Use the albuterol every 4-6 hours when needed  Take the prednisone 3 daily for 2 days, then 2 daily for 2 days, then 1 daily for 2 days as directed. Best taken in the morning after breakfast.  Take the Augmentin antibiotic (amoxicillin/clavulanate) 875 mg one twice daily  Take the Singulair (montelukast) 1 daily in the evening  Drink plenty of fluids to stay well hydrated  Avoid smoky in dusty environments the best you can  Return in 3 weeks for a recheck with Deboraha Sprang FNP for the pre-op clearance for Dr. Otelia Sergeant.  Call ahead to be sure her hours here before coming in.    IF  you received an x-ray today, you will receive an invoice from [facility]. Please contact [facility] at [phone] with questions or concerns regarding your invoice.   IF you received labwork today, you will receive an invoice from [facility]. Please contact [facility] at [phone] with questions or concerns regarding your invoice.   Our billing staff will not be able to assist you with questions regarding bills from these companies.  You will be contacted with the lab results as soon as they are available. The fastest way to get your results is to activate your My Chart account. Instructions are located on the last page of this paperwork. If you have not heard from us regarding the results in 2 weeks, please contact this office.          Return in about 3 weeks  (around 10/20/2015).   Lavan Imes, MD 09/29/2015

## 2015-09-29 NOTE — Patient Instructions (Addendum)
Continue using the Arnuity Elliptica inhaler daily  Use the albuterol every 4-6 hours when needed  Take the prednisone 3 daily for 2 days, then 2 daily for 2 days, then 1 daily for 2 days as directed. Best taken in the morning after breakfast.  Take the Augmentin antibiotic (amoxicillin/clavulanate) 875 mg one twice daily  Take the Singulair (montelukast) 1 daily in the evening  Drink plenty of fluids to stay well hydrated  Avoid smoky in dusty environments the best you can  Return in 3 weeks for a recheck with Deboraha Sprangebbie Gessner FNP for the pre-op clearance for Dr. Otelia SergeantNitka.  Call ahead to be sure her hours here before coming in.    IF you received an x-ray today, you will receive an invoice from [facility]. Please contact [facility] at [phone] with questions or concerns regarding your invoice.   IF you received labwork today, you will receive an invoice from [facility]. Please contact [facility] at [phone] with questions or concerns regarding your invoice.   Our billing staff will not be able to assist you with questions regarding bills from these companies.  You will be contacted with the lab results as soon as they are available. The fastest way to get your results is to activate your My Chart account. Instructions are located on the last page of this paperwork. If you have not heard from us regarding the results in 2 weeks, please contact this office.

## 2015-10-15 ENCOUNTER — Ambulatory Visit (INDEPENDENT_AMBULATORY_CARE_PROVIDER_SITE_OTHER): Payer: BLUE CROSS/BLUE SHIELD | Admitting: Emergency Medicine

## 2015-10-15 VITALS — BP 134/85 | HR 78 | Temp 97.9°F | Resp 16 | Ht 72.0 in | Wt 227.0 lb

## 2015-10-15 DIAGNOSIS — J4540 Moderate persistent asthma, uncomplicated: Secondary | ICD-10-CM

## 2015-10-15 DIAGNOSIS — Z01818 Encounter for other preprocedural examination: Secondary | ICD-10-CM | POA: Diagnosis not present

## 2015-10-15 NOTE — Patient Instructions (Addendum)
I will send a letter to Dr. Barbaraann FasterNitka's office to clear you for surgery, please call them regarding scheduling your surgery    IF you received an x-ray today, you will receive an invoice from [facility]. Please contact [facility] at [phone] with questions or concerns regarding your invoice.   IF you received labwork today, you will receive an invoice from [facility]. Please contact [facility] at [phone] with questions or concerns regarding your invoice.   Our billing staff will not be able to assist you with questions regarding bills from these companies.  You will be contacted with the lab results as soon as they are available. The fastest way to get your results is to activate your My Chart account. Instructions are located on the last page of this paperwork. If you have not heard from us regarding the results in 2 weeks, please contact this office.

## 2015-10-15 NOTE — Progress Notes (Signed)
   Subjective:    Patient ID: Philip NorfolkLarry J Eiland, male    DOB: 03-11-1956, 60 y.o.   MRN: [id number]  HPI This is a pleasant 60 yo male who presents today for follow up of asthma. He was seen by Dr. Alwyn RenHopper 09/29/2015 for asthma with acute exacerbation. He was treated with prednisone. He reports that his breathing is much better. He has been off work for the last week. 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 Has not needed his albuterol inhaler this week.   He has been working with Dr. Alwyn RenHopper to get clearance for back surgery with Dr. Otelia SergeantNitka. The hold up has been his breathing. He had normal EKG, CBC and CMP 09/16/15.   Past Medical History  Diagnosis Date  . Allergy   . Asthma    Past Surgical History  Procedure Laterality Date  . Left shoulder surgery    . Foreign body removal Right 05/22/2014    Procedure: FOREIGN BODY REMOVAL ADULT RIGHT INDEX;  Surgeon: Cindee SaltGary Kuzma, MD;  Location: [facility];  Service: Orthopedics;  Laterality: Right;   \ Family History  Problem Relation Age of Onset  . Diabetes Father   . Cancer Sister    Social History  Substance Use Topics  . Smoking status: Former Smoker    Quit date: 01/27/2004  . Smokeless tobacco: Never Used  . Alcohol Use: Yes    Review of Systems No chest pain, no SOB, no recent fever    Objective:   Physical Exam Physical Exam  Constitutional: Oriented to person, place, and time. He appears well-developed and well-nourished.  HENT:  Head: Normocephalic and atraumatic.  Eyes: Conjunctivae are normal.  Neck: Normal range of motion. Neck supple.  Cardiovascular: Normal rate, regular rhythm and normal heart sounds.  No Wheezing.  Pulmonary/Chest: Effort normal and breath sounds normal.  Musculoskeletal: Normal range of motion.  Neurological: Alert and oriented to person, place, and time.  Skin: Skin is warm and dry.  Psychiatric: Normal mood and affect. Behavior is  normal. Judgment and thought content normal.  Vitals reviewed.     BP 134/85 mmHg  Pulse 78  Temp(Src) 97.9 F (36.6 C)  Resp 16  Ht 6' (1.829 m)  Wt 227 lb (102.967 kg)  BMI 30.78 kg/m2 Office Spirometry Results: FEV1: 3.56 liters FVC: 4.87 liters FEV1/FVC: 73.1 % Predicted: 94 FVC  % Predicted: 113 liters FEV % Predicted: 107 liters FeF 25-75: 2.32 liters FeF 25-75 % Predicted: 77 Normal Spirometry      Assessment & Plan:   1. Asthma, moderate persistent, uncomplicated - Spirometry: Peak - Discussed with Dr. Cleta Albertsaub who suggested referral to Occ Med to see if patient could benefit from respirator.  - Ambulatory referral to Occupational Medicine  2. Preoperative clearance - Spirometry: Peak - Letter sent to Dr. Otelia SergeantNitka  - encouraged patient to follow up for CPE  Olean Reeeborah Britt Petroni, FNP-BC  [facility]  10/15/2015 3:28 PM

## 2015-11-05 ENCOUNTER — Encounter (HOSPITAL_COMMUNITY)
Admission: RE | Admit: 2015-11-05 | Discharge: 2015-11-05 | Disposition: A | Discharge status: Home / Self Care | Payer: BLUE CROSS/BLUE SHIELD | Source: Ambulatory Visit | Attending: Specialist | Admitting: Specialist

## 2015-11-05 ENCOUNTER — Other Ambulatory Visit (HOSPITAL_COMMUNITY): Payer: Self-pay | Admitting: *Deleted

## 2015-11-05 ENCOUNTER — Encounter (HOSPITAL_COMMUNITY): Payer: Self-pay

## 2015-11-05 DIAGNOSIS — Z01812 Encounter for preprocedural laboratory examination: Secondary | ICD-10-CM | POA: Diagnosis present

## 2015-11-05 DIAGNOSIS — Z0183 Encounter for blood typing: Secondary | ICD-10-CM | POA: Diagnosis not present

## 2015-11-05 DIAGNOSIS — M4316 Spondylolisthesis, lumbar region: Secondary | ICD-10-CM | POA: Insufficient documentation

## 2015-11-05 DIAGNOSIS — M4807 Spinal stenosis, lumbosacral region: Secondary | ICD-10-CM | POA: Insufficient documentation

## 2015-11-05 HISTORY — DX: Unspecified osteoarthritis, unspecified site: M19.90

## 2015-11-05 LAB — ABO/RH: ABO/RH(D): O POS

## 2015-11-05 LAB — TYPE AND SCREEN
ABO/RH(D): O POS
ANTIBODY SCREEN: NEGATIVE

## 2015-11-05 LAB — CBC
HCT: 46.9 % (ref 39.0–52.0)
Hemoglobin: 15.7 g/dL (ref 13.0–17.0)
MCH: 29.4 pg (ref 26.0–34.0)
MCHC: 33.5 g/dL (ref 30.0–36.0)
MCV: 87.8 fL (ref 78.0–100.0)
PLATELETS: 177 10*3/uL (ref 150–400)
RBC: 5.34 MIL/uL (ref 4.22–5.81)
RDW: 13.1 % (ref 11.5–15.5)
WBC: 4.7 10*3/uL (ref 4.0–10.5)

## 2015-11-05 LAB — APTT: aPTT: 30 seconds (ref 24–37)

## 2015-11-05 LAB — COMPREHENSIVE METABOLIC PANEL
ALT: 33 U/L (ref 17–63)
AST: 21 U/L (ref 15–41)
Albumin: 4 g/dL (ref 3.5–5.0)
Alkaline Phosphatase: 64 U/L (ref 38–126)
Anion gap: 8 (ref 5–15)
BUN: 13 mg/dL (ref 6–20)
CHLORIDE: 106 mmol/L (ref 101–111)
CO2: 24 mmol/L (ref 22–32)
CREATININE: 1.28 mg/dL — AB (ref 0.61–1.24)
Calcium: 9.3 mg/dL (ref 8.9–10.3)
GFR, EST NON AFRICAN AMERICAN: 58 mL/min — AB (ref >60)
Glucose, Bld: 131 mg/dL — ABNORMAL HIGH (ref 65–99)
POTASSIUM: 3.9 mmol/L (ref 3.5–5.1)
SODIUM: 138 mmol/L (ref 135–145)
Total Bilirubin: 1 mg/dL (ref 0.3–1.2)
Total Protein: 7.1 g/dL (ref 6.5–8.1)

## 2015-11-05 LAB — URINALYSIS, ROUTINE W REFLEX MICROSCOPIC
Bilirubin Urine: NEGATIVE
GLUCOSE, UA: NEGATIVE mg/dL
Hgb urine dipstick: NEGATIVE
KETONES UR: NEGATIVE mg/dL
LEUKOCYTES UA: NEGATIVE
NITRITE: NEGATIVE
PROTEIN: NEGATIVE mg/dL
Specific Gravity, Urine: 1.019 (ref 1.005–1.030)
pH: 5 (ref 5.0–8.0)

## 2015-11-05 LAB — PROTIME-INR
INR: 1.04 (ref 0.00–1.49)
PROTHROMBIN TIME: 13.8 s (ref 11.6–15.2)

## 2015-11-05 LAB — SURGICAL PCR SCREEN
MRSA, PCR: NEGATIVE
Staphylococcus aureus: NEGATIVE

## 2015-11-05 NOTE — Progress Notes (Signed)
PCP is Dr [facility]pper [facility] Denies ever seeing a cardiologist Denies ever having a card cath, stress test, or echo. Denies any chest pain, cough, or fever..Marland Kitchen

## 2015-11-05 NOTE — Pre-Procedure Instructions (Signed)
Philip Peters  11/05/2015      [facility] [address] [address], [place] - [address] AT [facility] OF [address] [place] [address] Phone: [phone] Fax: [phone]    Your procedure is scheduled on Tuesday, November 11, 2015 at 7:30 AM.  Report to [facility] Entrance "A" Admitting Office at 5:30 AM.  Call this number if you have problems the morning of surgery: [phone]  Any questions prior to day of surgery, please call [phone] between 8 & 4 PM.   Remember:  Do not eat food or drink liquids after midnight Monday, 11/10/15.  Take these medicines the morning of surgery with A SIP OF WATER: Hydrocodone - if needed, Arnuity Ellipta inhaler, Albuterol (Proventil) nebulizer - if needed, Albuterol (Proventil) inhaler - if needed (bring this one with you day of surgery)   Do not wear jewelry.  Do not wear lotions, powders, or cologne.  You may wear deodorant.  Men may shave face and neck.  Do not bring valuables to the hospital.  [facility] is not responsible for any belongings or valuables.  Contacts, dentures or bridgework may not be worn into surgery.  Leave your suitcase in the car.  After surgery it may be brought to your room.  For patients admitted to the hospital, discharge time will be determined by your treatment team.  Special instructions:  [place] - Preparing for Surgery  Before surgery, you can play an important role.  Because skin is not sterile, your skin needs to be as free of germs as possible.  You can reduce the number of germs on you skin by washing with CHG (chlorahexidine gluconate) soap before surgery.  CHG is an antiseptic cleaner which kills germs and bonds with the skin to continue killing germs even after washing.  Please DO NOT use if you have an allergy to CHG or antibacterial soaps.  If your skin becomes reddened/irritated stop using the CHG and inform your nurse when you arrive at  Short Stay.  Do not shave (including legs and underarms) for at least 48 hours prior to the first CHG shower.  You may shave your face.  Please follow these instructions carefully:   1.  Shower with CHG Soap the night before surgery and the                                morning of Surgery.  2.  If you choose to wash your hair, wash your hair first as usual with your       normal shampoo.  3.  After you shampoo, rinse your hair and body thoroughly to remove the                      Shampoo.  4.  Use CHG as you would any other liquid soap.  You can apply chg directly       to the skin and wash gently with scrungie or a clean washcloth.  5.  Apply the CHG Soap to your body ONLY FROM THE NECK DOWN.        Do not use on open wounds or open sores.  Avoid contact with your eyes, ears, mouth and genitals (private parts).  Wash genitals (private parts) with your normal soap.  6.  Wash thoroughly, paying special attention to the area where your  surgery        will be performed.  7.  Thoroughly rinse your body with warm water from the neck down.  8.  DO NOT shower/wash with your normal soap after using and rinsing off       the CHG Soap.  9.  Pat yourself dry with a clean towel.            10.  Wear clean pajamas.            11.  Place clean sheets on your bed the night of your first shower and do not        sleep with pets.  Day of Surgery  Do not apply any lotions the morning of surgery.  Please wear clean clothes to the hospital.   Please read over the following fact sheets that you were given. Pain Booklet, Coughing and Deep Breathing, MRSA Information and Surgical Site Infection Prevention

## 2015-11-10 MED ORDER — CEFAZOLIN SODIUM-DEXTROSE 2-4 GM/100ML-% IV SOLN
2.0000 g | INTRAVENOUS | Status: AC
  Administered 2015-11-11 (×2): 2 g via INTRAVENOUS
  Filled 2015-11-10: qty 100

## 2015-11-10 NOTE — H&P (Signed)
Philip Peters is an 60 y.o. male.   Chief Complaint: low back pain and left leg pain HPI:  Patient presented to our office with the above complaints.  Worsening over the last several years.  Failed conservative treatment.  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    Past Medical History  Diagnosis Date  . Allergy   . Asthma   . Arthritis     Past Surgical History  Procedure Laterality Date  . Left shoulder surgery    . Foreign body removal Right 05/22/2014    Procedure: FOREIGN BODY REMOVAL ADULT RIGHT INDEX;  Surgeon: Cindee SaltGary Kuzma, MD;  Location: [facility];  Service: Orthopedics;  Laterality: Right;    Family History  Problem Relation Age of Onset  . Diabetes Father   . Cancer Sister    Social History:  reports that he quit smoking about 11 years ago. He has never used smokeless tobacco. He reports that he drinks alcohol. He reports that he does not use illicit drugs.  Allergies: No Known Allergies  No prescriptions prior to admission    No results found for this or any previous visit (from the past 48 hour(s)). No results found.  Review of Systems  Constitutional: Negative.   HENT: Negative.   Eyes: Negative.   Respiratory: Negative.   Cardiovascular: Negative.   Genitourinary: Negative.   Musculoskeletal: Positive for back pain.  Skin: Negative.   Neurological: Negative.   Psychiatric/Behavioral: Negative.     There were no vitals taken for this visit. Physical Exam  Constitutional: He is oriented to person, place, and time. No distress.  HENT:  Head: Atraumatic.  Eyes: EOM are normal.  Neck: Normal range of motion.  Cardiovascular: Normal rate.   Respiratory: No respiratory distress.  GI: He exhibits no distension.  Musculoskeletal: He exhibits tenderness.  Neurological: He is alert and oriented to person, place, and time.  Skin: Skin is warm and dry.  Psychiatric: He has a normal mood and affect.     CLINICAL DATA: 60 year old male with central lumbar back pain radiating to the left thigh for 4 years. No known injury. Subsequent encounter.  EXAM: MRI LUMBAR SPINE WITHOUT CONTRAST  TECHNIQUE: Multiplanar, multisequence MR imaging of the lumbar spine was performed. No intravenous contrast was administered.  COMPARISON: Lumbar radiographs 06/02/2015 and earlier.  FINDINGS: Normal lumbar segmentation demonstrated on the comparisons. Stable vertebral height and alignment. There is trace anterolisthesis at L4-L5. No marrow edema or evidence of acute osseous abnormality.  Visualized lower thoracic spinal cord is normal with conus medularis at L1-L2.  Negative visualized abdominal viscera; several benign appearing renal cysts. Negative visualized posterior paraspinal soft tissues.  T11-T12: Mild left facet hypertrophy.  T12-L1: Negative.  L1-L2: Negative.  L2-L3: Mild circumferential disc bulge. Mild epidural lipomatosis. No significant stenosis.  L3-L4: Mild circumferential disc bulge. Mild facet hypertrophy. Mild epidural lipomatosis. No significant stenosis.  L4-L5: Mild grade 1 anterolisthesis. Moderate facet hypertrophy with trace facet joint fluid. Moderate ligament flavum hypertrophy. Circumferential disc bulge with broad-based posterior component of disc. Epidural lipomatosis. Borderline to mild spinal stenosis. In the left neural foramen there is asymmetric increased low to intermediate signal space-occupying material best seen on series 8, image 12. There is associated moderate to severe left L4 foraminal stenosis. It is unclear whether this is emanating from the degenerated left facet, or might be an unusual sequestered disc fragment.  L5-S1: Disc space loss with circumferential disc osteophyte complex.  Broad-based posterior component of disc. Mild facet hypertrophy. No spinal or lateral recess stenosis. Moderate bilateral L5  foraminal stenosis is primarily due to bony endplate spurring.  IMPRESSION: 1. Symptomatic level favored to be L4-L5 where the facets are moderately degenerated in the setting of trace anterolisthesis and there is severe left foraminal stenosis related to either an 8 mm sequestered disc fragment or intermediate signal synovial cyst occupying the left neural foramen and impinging on the exiting left L4 nerve. See series 8, image 12. 2. 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   Will proceed withTRANSFORAMINAL LUMBAR INTERBODY FUSIONS L4-5 AND L5-S1 WITH CAGES, PEDICLE SCREWS AND RODS.  Surgical procedure along with possible risks, complications and recovery time discussed in detail.  All questions answered and wishes to proceed.  Naida Sleight, PA-C 11/10/2015, 9:39 PM

## 2015-11-11 ENCOUNTER — Inpatient Hospital Stay (HOSPITAL_COMMUNITY): Payer: BLUE CROSS/BLUE SHIELD

## 2015-11-11 ENCOUNTER — Inpatient Hospital Stay (HOSPITAL_COMMUNITY)
Admission: RE | Admit: 2015-11-11 | Discharge: 2015-11-15 | DRG: 460 | Disposition: A | Discharge status: Home Health | Payer: BLUE CROSS/BLUE SHIELD | Source: Ambulatory Visit | Attending: Specialist | Admitting: Specialist

## 2015-11-11 ENCOUNTER — Encounter (HOSPITAL_COMMUNITY)
Admission: RE | Disposition: A | Discharge status: Home Health | Payer: Self-pay | Source: Ambulatory Visit | Attending: Specialist

## 2015-11-11 ENCOUNTER — Inpatient Hospital Stay (HOSPITAL_COMMUNITY): Payer: BLUE CROSS/BLUE SHIELD | Admitting: Anesthesiology

## 2015-11-11 ENCOUNTER — Encounter (HOSPITAL_COMMUNITY): Payer: Self-pay | Admitting: Surgery

## 2015-11-11 DIAGNOSIS — M4316 Spondylolisthesis, lumbar region: Secondary | ICD-10-CM | POA: Diagnosis present

## 2015-11-11 DIAGNOSIS — M4806 Spinal stenosis, lumbar region: Secondary | ICD-10-CM | POA: Diagnosis present

## 2015-11-11 DIAGNOSIS — M51369 Other intervertebral disc degeneration, lumbar region without mention of lumbar back pain or lower extremity pain: Secondary | ICD-10-CM | POA: Diagnosis present

## 2015-11-11 DIAGNOSIS — M5136 Other intervertebral disc degeneration, lumbar region: Secondary | ICD-10-CM | POA: Diagnosis present

## 2015-11-11 DIAGNOSIS — D649 Anemia, unspecified: Secondary | ICD-10-CM | POA: Diagnosis not present

## 2015-11-11 DIAGNOSIS — M545 Low back pain: Secondary | ICD-10-CM | POA: Diagnosis present

## 2015-11-11 DIAGNOSIS — J4540 Moderate persistent asthma, uncomplicated: Secondary | ICD-10-CM | POA: Diagnosis present

## 2015-11-11 DIAGNOSIS — Z87891 Personal history of nicotine dependence: Secondary | ICD-10-CM | POA: Diagnosis not present

## 2015-11-11 DIAGNOSIS — M48062 Spinal stenosis, lumbar region with neurogenic claudication: Secondary | ICD-10-CM | POA: Diagnosis present

## 2015-11-11 DIAGNOSIS — Z7951 Long term (current) use of inhaled steroids: Secondary | ICD-10-CM | POA: Diagnosis not present

## 2015-11-11 DIAGNOSIS — Z419 Encounter for procedure for purposes other than remedying health state, unspecified: Secondary | ICD-10-CM

## 2015-11-11 DIAGNOSIS — M4807 Spinal stenosis, lumbosacral region: Secondary | ICD-10-CM | POA: Diagnosis present

## 2015-11-11 HISTORY — PX: LUMBAR FUSION: SHX111

## 2015-11-11 SURGERY — FUSION, SPINE, LUMBAR, MINIMALLY INVASIVE
Anesthesia: General | Site: Spine Lumbar

## 2015-11-11 MED ORDER — HYDROCODONE-ACETAMINOPHEN 5-325 MG PO TABS: 1.0000 | ORAL_TABLET | ORAL | Status: DC | PRN

## 2015-11-11 MED ORDER — BISACODYL 5 MG PO TBEC
5.0000 mg | DELAYED_RELEASE_TABLET | Freq: Every day | ORAL | Status: DC | PRN
  Administered 2015-11-12: 5 mg via ORAL
  Filled 2015-11-11: qty 1

## 2015-11-11 MED ORDER — ONDANSETRON HCL 4 MG/2ML IJ SOLN
INTRAMUSCULAR | Status: AC
  Filled 2015-11-11: qty 2

## 2015-11-11 MED ORDER — LACTATED RINGERS IV SOLN
INTRAVENOUS | Status: DC | PRN
  Administered 2015-11-11 (×2): via INTRAVENOUS

## 2015-11-11 MED ORDER — CEFAZOLIN SODIUM 1 G IJ SOLR
INTRAMUSCULAR | Status: AC
  Filled 2015-11-11: qty 20

## 2015-11-11 MED ORDER — SODIUM CHLORIDE 0.9 % IV SOLN
INTRAVENOUS | Status: DC
  Administered 2015-11-11: 17:00:00 via INTRAVENOUS

## 2015-11-11 MED ORDER — ONDANSETRON HCL 4 MG/2ML IJ SOLN: 4.0000 mg | INTRAMUSCULAR | Status: DC | PRN

## 2015-11-11 MED ORDER — ACETAMINOPHEN 325 MG PO TABS: 650.0000 mg | ORAL_TABLET | ORAL | Status: DC | PRN

## 2015-11-11 MED ORDER — SODIUM CHLORIDE 0.9 % IV SOLN: 250.0000 mL | INTRAVENOUS | Status: DC

## 2015-11-11 MED ORDER — OXYCODONE HCL 5 MG PO TABS
5.0000 mg | ORAL_TABLET | Freq: Once | ORAL | Status: AC | PRN
  Administered 2015-11-11: 5 mg via ORAL

## 2015-11-11 MED ORDER — SUGAMMADEX SODIUM 200 MG/2ML IV SOLN
INTRAVENOUS | Status: AC
  Filled 2015-11-11: qty 2

## 2015-11-11 MED ORDER — ACETAMINOPHEN 650 MG RE SUPP: 650.0000 mg | RECTAL | Status: DC | PRN

## 2015-11-11 MED ORDER — BUPIVACAINE LIPOSOME 1.3 % IJ SUSP
20.0000 mL | INTRAMUSCULAR | Status: AC
  Administered 2015-11-11: 20 mL
  Filled 2015-11-11: qty 20

## 2015-11-11 MED ORDER — SUCCINYLCHOLINE CHLORIDE 20 MG/ML IJ SOLN
INTRAMUSCULAR | Status: DC | PRN
  Administered 2015-11-11: 120 mg via INTRAVENOUS

## 2015-11-11 MED ORDER — LIDOCAINE HCL (CARDIAC) 20 MG/ML IV SOLN
INTRAVENOUS | Status: DC | PRN
  Administered 2015-11-11: 100 mg via INTRAVENOUS

## 2015-11-11 MED ORDER — DOCUSATE SODIUM 100 MG PO CAPS
100.0000 mg | ORAL_CAPSULE | Freq: Two times a day (BID) | ORAL | Status: DC
  Administered 2015-11-11 – 2015-11-15 (×7): 100 mg via ORAL
  Filled 2015-11-11 (×8): qty 1

## 2015-11-11 MED ORDER — PANTOPRAZOLE SODIUM 40 MG PO TBEC
40.0000 mg | DELAYED_RELEASE_TABLET | Freq: Every day | ORAL | Status: DC
  Administered 2015-11-12 – 2015-11-15 (×4): 40 mg via ORAL
  Filled 2015-11-11 (×4): qty 1

## 2015-11-11 MED ORDER — MONTELUKAST SODIUM 10 MG PO TABS
10.0000 mg | ORAL_TABLET | Freq: Every day | ORAL | Status: DC
  Administered 2015-11-11 – 2015-11-14 (×4): 10 mg via ORAL
  Filled 2015-11-11 (×4): qty 1

## 2015-11-11 MED ORDER — 0.9 % SODIUM CHLORIDE (POUR BTL) OPTIME
TOPICAL | Status: DC | PRN
  Administered 2015-11-11: 1000 mL

## 2015-11-11 MED ORDER — POLYETHYLENE GLYCOL 3350 17 G PO PACK
17.0000 g | PACK | Freq: Every day | ORAL | Status: DC | PRN
  Administered 2015-11-12: 17 g via ORAL
  Filled 2015-11-11: qty 1

## 2015-11-11 MED ORDER — SODIUM CHLORIDE 0.9% FLUSH
3.0000 mL | Freq: Two times a day (BID) | INTRAVENOUS | Status: DC
  Administered 2015-11-12 – 2015-11-15 (×5): 3 mL via INTRAVENOUS

## 2015-11-11 MED ORDER — FENTANYL CITRATE (PF) 100 MCG/2ML IJ SOLN
INTRAMUSCULAR | Status: DC | PRN
Start: 2015-11-11 — End: 2015-11-11
  Administered 2015-11-11: 50 ug via INTRAVENOUS
  Administered 2015-11-11: 150 ug via INTRAVENOUS
  Administered 2015-11-11 (×2): 50 ug via INTRAVENOUS
  Administered 2015-11-11 (×2): 25 ug via INTRAVENOUS
  Administered 2015-11-11 (×3): 50 ug via INTRAVENOUS

## 2015-11-11 MED ORDER — FENTANYL CITRATE (PF) 250 MCG/5ML IJ SOLN
INTRAMUSCULAR | Status: AC
  Filled 2015-11-11: qty 5

## 2015-11-11 MED ORDER — ALBUTEROL SULFATE (2.5 MG/3ML) 0.083% IN NEBU: 3.0000 mL | INHALATION_SOLUTION | RESPIRATORY_TRACT | Status: DC | PRN

## 2015-11-11 MED ORDER — BUDESONIDE 0.5 MG/2ML IN SUSP
0.5000 mg | Freq: Two times a day (BID) | RESPIRATORY_TRACT | Status: DC
  Administered 2015-11-12 – 2015-11-15 (×5): 0.5 mg via RESPIRATORY_TRACT
  Filled 2015-11-11 (×9): qty 2

## 2015-11-11 MED ORDER — CEFAZOLIN SODIUM 1-5 GM-% IV SOLN
1.0000 g | Freq: Three times a day (TID) | INTRAVENOUS | Status: AC
  Administered 2015-11-11 – 2015-11-12 (×2): 1 g via INTRAVENOUS
  Filled 2015-11-11 (×2): qty 50

## 2015-11-11 MED ORDER — SUGAMMADEX SODIUM 200 MG/2ML IV SOLN
INTRAVENOUS | Status: DC | PRN
  Administered 2015-11-11: 200 mg via INTRAVENOUS

## 2015-11-11 MED ORDER — KETOROLAC TROMETHAMINE 30 MG/ML IJ SOLN
INTRAMUSCULAR | Status: AC
  Filled 2015-11-11: qty 1

## 2015-11-11 MED ORDER — MORPHINE SULFATE (PF) 2 MG/ML IV SOLN
1.0000 mg | INTRAVENOUS | Status: DC | PRN
  Administered 2015-11-11 – 2015-11-12 (×3): 2 mg via INTRAVENOUS
  Filled 2015-11-11 (×3): qty 1

## 2015-11-11 MED ORDER — METHOCARBAMOL 1000 MG/10ML IJ SOLN
500.0000 mg | Freq: Four times a day (QID) | INTRAMUSCULAR | Status: DC | PRN
  Filled 2015-11-11: qty 5

## 2015-11-11 MED ORDER — BUPIVACAINE-EPINEPHRINE (PF) 0.5% -1:200000 IJ SOLN
INTRAMUSCULAR | Status: AC
  Filled 2015-11-11: qty 30

## 2015-11-11 MED ORDER — MIDAZOLAM HCL 5 MG/5ML IJ SOLN
INTRAMUSCULAR | Status: DC | PRN
  Administered 2015-11-11: 2 mg via INTRAVENOUS

## 2015-11-11 MED ORDER — SODIUM CHLORIDE 0.9 % IJ SOLN
INTRAMUSCULAR | Status: AC
  Filled 2015-11-11: qty 20

## 2015-11-11 MED ORDER — CHLORHEXIDINE GLUCONATE 4 % EX LIQD: 60.0000 mL | Freq: Once | CUTANEOUS | Status: DC

## 2015-11-11 MED ORDER — MENTHOL 3 MG MT LOZG: 1.0000 | LOZENGE | OROMUCOSAL | Status: DC | PRN

## 2015-11-11 MED ORDER — THROMBIN 20000 UNITS EX SOLR
CUTANEOUS | Status: AC
  Filled 2015-11-11: qty 20000

## 2015-11-11 MED ORDER — PHENOL 1.4 % MT LIQD: 1.0000 | OROMUCOSAL | Status: DC | PRN

## 2015-11-11 MED ORDER — METHOCARBAMOL 500 MG PO TABS
500.0000 mg | ORAL_TABLET | Freq: Four times a day (QID) | ORAL | Status: DC | PRN
  Administered 2015-11-11 – 2015-11-13 (×2): 500 mg via ORAL
  Filled 2015-11-11: qty 1

## 2015-11-11 MED ORDER — ALUM & MAG HYDROXIDE-SIMETH 200-200-20 MG/5ML PO SUSP: 30.0000 mL | Freq: Four times a day (QID) | ORAL | Status: DC | PRN

## 2015-11-11 MED ORDER — OXYCODONE-ACETAMINOPHEN 5-325 MG PO TABS
1.0000 | ORAL_TABLET | ORAL | Status: DC | PRN
  Administered 2015-11-11 – 2015-11-15 (×15): 2 via ORAL
  Filled 2015-11-11 (×15): qty 2

## 2015-11-11 MED ORDER — ROCURONIUM BROMIDE 100 MG/10ML IV SOLN
INTRAVENOUS | Status: DC | PRN
  Administered 2015-11-11: 40 mg via INTRAVENOUS
  Administered 2015-11-11: 20 mg via INTRAVENOUS
  Administered 2015-11-11: 40 mg via INTRAVENOUS

## 2015-11-11 MED ORDER — BUPIVACAINE HCL 0.5 % IJ SOLN
INTRAMUSCULAR | Status: DC | PRN
  Administered 2015-11-11: 20 mL

## 2015-11-11 MED ORDER — ONDANSETRON HCL 4 MG/2ML IJ SOLN: 4.0000 mg | Freq: Once | INTRAMUSCULAR | Status: DC | PRN

## 2015-11-11 MED ORDER — FLEET ENEMA 7-19 GM/118ML RE ENEM
1.0000 | ENEMA | Freq: Once | RECTAL | Status: AC | PRN
  Administered 2015-11-14: 1 via RECTAL
  Filled 2015-11-11: qty 1

## 2015-11-11 MED ORDER — MIDAZOLAM HCL 2 MG/2ML IJ SOLN
INTRAMUSCULAR | Status: AC
  Filled 2015-11-11: qty 2

## 2015-11-11 MED ORDER — PROPOFOL 500 MG/50ML IV EMUL
INTRAVENOUS | Status: DC | PRN
  Administered 2015-11-11: 50 ug/kg/min via INTRAVENOUS

## 2015-11-11 MED ORDER — ALBUMIN HUMAN 5 % IV SOLN
INTRAVENOUS | Status: DC | PRN
  Administered 2015-11-11: 13:00:00 via INTRAVENOUS

## 2015-11-11 MED ORDER — THROMBIN 20000 UNITS EX KIT
PACK | CUTANEOUS | Status: DC | PRN
  Administered 2015-11-11: 20 mL via TOPICAL

## 2015-11-11 MED ORDER — METHOCARBAMOL 500 MG PO TABS
ORAL_TABLET | ORAL | Status: AC
  Filled 2015-11-11: qty 1

## 2015-11-11 MED ORDER — PROPOFOL 10 MG/ML IV BOLUS
INTRAVENOUS | Status: DC | PRN
  Administered 2015-11-11: 200 mg via INTRAVENOUS

## 2015-11-11 MED ORDER — ALBUTEROL SULFATE (2.5 MG/3ML) 0.083% IN NEBU: 2.5000 mg | INHALATION_SOLUTION | Freq: Four times a day (QID) | RESPIRATORY_TRACT | Status: DC | PRN

## 2015-11-11 MED ORDER — PROPOFOL 10 MG/ML IV BOLUS
INTRAVENOUS | Status: AC
  Filled 2015-11-11: qty 20

## 2015-11-11 MED ORDER — KETOROLAC TROMETHAMINE 30 MG/ML IJ SOLN
30.0000 mg | Freq: Once | INTRAMUSCULAR | Status: AC
  Administered 2015-11-11: 30 mg via INTRAVENOUS

## 2015-11-11 MED ORDER — SODIUM CHLORIDE 0.9% FLUSH: 3.0000 mL | INTRAVENOUS | Status: DC | PRN

## 2015-11-11 MED ORDER — OXYCODONE HCL 5 MG/5ML PO SOLN: 5.0000 mg | Freq: Once | ORAL | Status: AC | PRN

## 2015-11-11 MED ORDER — ONDANSETRON HCL 4 MG/2ML IJ SOLN
INTRAMUSCULAR | Status: DC | PRN
  Administered 2015-11-11: 4 mg via INTRAVENOUS

## 2015-11-11 MED ORDER — SODIUM CHLORIDE 0.9 % IV SOLN
INTRAVENOUS | Status: DC | PRN
  Administered 2015-11-11: 12:00:00 via INTRAVENOUS

## 2015-11-11 MED ORDER — OXYCODONE HCL 5 MG PO TABS
ORAL_TABLET | ORAL | Status: AC
  Filled 2015-11-11: qty 1

## 2015-11-11 MED ORDER — HYDROMORPHONE HCL 1 MG/ML IJ SOLN: 0.2500 mg | INTRAMUSCULAR | Status: DC | PRN

## 2015-11-11 MED ORDER — SODIUM CHLORIDE 0.9 % IV SOLN
10.0000 mg | INTRAVENOUS | Status: DC | PRN
  Administered 2015-11-11: 25 ug/min via INTRAVENOUS

## 2015-11-11 MED ORDER — SUCCINYLCHOLINE CHLORIDE 200 MG/10ML IV SOSY
PREFILLED_SYRINGE | INTRAVENOUS | Status: AC
  Filled 2015-11-11: qty 10

## 2015-11-11 MED FILL — Heparin Sodium (Porcine) Inj 1000 Unit/ML: INTRAMUSCULAR | Qty: 30 | Status: AC

## 2015-11-11 MED FILL — Sodium Chloride IV Soln 0.9%: INTRAVENOUS | Qty: 1000 | Status: AC

## 2015-11-11 MED FILL — Sodium Chloride Irrigation Soln 0.9%: Qty: 3000 | Status: AC

## 2015-11-11 SURGICAL SUPPLY — 88 items
ADH SKN CLS APL DERMABOND .7 (GAUZE/BANDAGES/DRESSINGS) ×1
APL SKNCLS STERI-STRIP NONHPOA (GAUZE/BANDAGES/DRESSINGS) ×1
BAG DECANTER FOR FLEXI CONT (MISCELLANEOUS) IMPLANT
BENZOIN TINCTURE PRP APPL 2/3 (GAUZE/BANDAGES/DRESSINGS) ×1 IMPLANT
BIT DRILL NEURO 2X3.1 SFT TUCH (MISCELLANEOUS) ×1 IMPLANT
BLADE SURG ROTATE 9660 (MISCELLANEOUS) IMPLANT
BONE CANC CHIPS 20CC PCAN1/4 (Bone Implant) ×2 IMPLANT
BONE VIVIGEN FORMABLE 5.4CC (Bone Implant) ×2 IMPLANT
BUR MATCHSTICK NEURO 3.0 LAGG (BURR) ×2 IMPLANT
BUR RND FLUTED 2.5 (BURR) IMPLANT
BUR SABER RD CUTTING 3.0 (BURR) IMPLANT
CAGE CONCORDE BULLET 9X8X27 (Cage) ×2 IMPLANT
CAGE SPNL 5D BLT NOSE 27X9X8X (Cage) IMPLANT
CHIPS CANC BONE 20CC PCAN1/4 (Bone Implant) ×1 IMPLANT
CLSR STERI-STRIP ANTIMIC 1/2X4 (GAUZE/BANDAGES/DRESSINGS) ×1 IMPLANT
COVER SURGICAL LIGHT HANDLE (MISCELLANEOUS) ×2 IMPLANT
DERMABOND ADVANCED (GAUZE/BANDAGES/DRESSINGS) ×1
DERMABOND ADVANCED .7 DNX12 (GAUZE/BANDAGES/DRESSINGS) ×1 IMPLANT
DRAPE C-ARM 42X72 X-RAY (DRAPES) ×2 IMPLANT
DRAPE C-ARMOR (DRAPES) ×2 IMPLANT
DRAPE SURG 17X23 STRL (DRAPES) ×8 IMPLANT
DRAPE TABLE COVER HEAVY DUTY (DRAPES) ×2 IMPLANT
DRILL NEURO 2X3.1 SOFT TOUCH (MISCELLANEOUS) ×2
DRSG MEPILEX BORDER 4X4 (GAUZE/BANDAGES/DRESSINGS) IMPLANT
DRSG MEPILEX BORDER 4X8 (GAUZE/BANDAGES/DRESSINGS) ×1 IMPLANT
DURAPREP 26ML APPLICATOR (WOUND CARE) ×2 IMPLANT
ELECT BLADE 6.5 EXT (BLADE) IMPLANT
ELECT CAUTERY BLADE 6.4 (BLADE) ×2 IMPLANT
ELECT REM PT RETURN 9FT ADLT (ELECTROSURGICAL) ×2
ELECTRODE REM PT RTRN 9FT ADLT (ELECTROSURGICAL) ×1 IMPLANT
EVACUATOR 1/8 PVC DRAIN (DRAIN) IMPLANT
FEE INTRAOP MONITOR IMPULS NCS (MISCELLANEOUS) IMPLANT
GAUZE SPONGE 4X4 12PLY STRL (GAUZE/BANDAGES/DRESSINGS) ×2 IMPLANT
GLOVE BIOGEL PI IND STRL 7.0 (GLOVE) IMPLANT
GLOVE BIOGEL PI IND STRL 8 (GLOVE) ×1 IMPLANT
GLOVE BIOGEL PI INDICATOR 7.0 (GLOVE) ×1
GLOVE BIOGEL PI INDICATOR 8 (GLOVE) ×1
GLOVE ECLIPSE 9.0 STRL (GLOVE) ×2 IMPLANT
GLOVE ORTHO TXT STRL SZ7.5 (GLOVE) ×2 IMPLANT
GLOVE SURG 8.5 LATEX PF (GLOVE) ×2 IMPLANT
GOWN STRL REUS W/ TWL LRG LVL3 (GOWN DISPOSABLE) ×1 IMPLANT
GOWN STRL REUS W/TWL 2XL LVL3 (GOWN DISPOSABLE) ×4 IMPLANT
GOWN STRL REUS W/TWL LRG LVL3 (GOWN DISPOSABLE) ×2
GRAFT BNE CANC CHIPS 1-8 20CC (Bone Implant) IMPLANT
GRAFT BNE MATRIX VG FRMBL MD 5 (Bone Implant) IMPLANT
INTRAOP MONITOR FEE IMPULS NCS (MISCELLANEOUS) ×1
INTRAOP MONITOR FEE IMPULSE (MISCELLANEOUS) ×1
KIT BASIN OR (CUSTOM PROCEDURE TRAY) ×2 IMPLANT
KIT POSITION SURG JACKSON T1 (MISCELLANEOUS) ×2 IMPLANT
KIT ROOM TURNOVER OR (KITS) ×2 IMPLANT
NDL ASP BONE MRW 11GX15 (NEEDLE) IMPLANT
NDL SPNL 18GX3.5 QUINCKE PK (NEEDLE) ×1 IMPLANT
NEEDLE 22X1 1/2 (OR ONLY) (NEEDLE) ×2 IMPLANT
NEEDLE ASP BONE MRW 11GX15 (NEEDLE) IMPLANT
NEEDLE BONE MARROW 8GAX6 (NEEDLE) IMPLANT
NEEDLE SPNL 18GX3.5 QUINCKE PK (NEEDLE) ×2 IMPLANT
NS IRRIG 1000ML POUR BTL (IV SOLUTION) ×2 IMPLANT
PACK LAMINECTOMY ORTHO (CUSTOM PROCEDURE TRAY) ×2 IMPLANT
PAD ARMBOARD 7.5X6 YLW CONV (MISCELLANEOUS) ×4 IMPLANT
PATTIES SURGICAL .75X.75 (GAUZE/BANDAGES/DRESSINGS) ×2 IMPLANT
PATTIES SURGICAL 1X1 (DISPOSABLE) ×2 IMPLANT
ROD EXPEDIUM PER BENT 65MM (Rod) ×2 IMPLANT
ROD EXPEDIUM PRE BENT 5.5X75 (Rod) ×2 IMPLANT
SCREW CORTICAL VIPER 7X35 (Screw) ×2 IMPLANT
SCREW CORTICAL VIPER 7X40MM (Screw) ×2 IMPLANT
SCREW CORTICAL VIPER 7X45MM (Screw) ×2 IMPLANT
SCREW SET SINGLE INNER (Screw) ×7 IMPLANT
SPACER TPAL 10X10X28MM (Spacer) ×1 IMPLANT
SPONGE SURGIFOAM ABS GEL 100 (HEMOSTASIS) ×2 IMPLANT
SURGIFLO W/THROMBIN 8M KIT (HEMOSTASIS) IMPLANT
SUT VIC AB 0 CT1 27 (SUTURE) ×2
SUT VIC AB 0 CT1 27XBRD ANBCTR (SUTURE) ×1 IMPLANT
SUT VIC AB 1 CT1 27 (SUTURE) ×2
SUT VIC AB 1 CT1 27XBRD ANBCTR (SUTURE) IMPLANT
SUT VIC AB 1 CTX 36 (SUTURE) ×6
SUT VIC AB 1 CTX36XBRD ANBCTR (SUTURE) ×2 IMPLANT
SUT VIC AB 2-0 CT1 27 (SUTURE) ×2
SUT VIC AB 2-0 CT1 TAPERPNT 27 (SUTURE) ×1 IMPLANT
SUT VIC AB 3-0 X1 27 (SUTURE) ×4 IMPLANT
SUT VICRYL 0 CT 1 36IN (SUTURE) ×4 IMPLANT
SYR 20CC LL (SYRINGE) ×2 IMPLANT
SYR CONTROL 10ML LL (SYRINGE) ×4 IMPLANT
TOWEL OR 17X24 6PK STRL BLUE (TOWEL DISPOSABLE) ×2 IMPLANT
TOWEL OR 17X26 10 PK STRL BLUE (TOWEL DISPOSABLE) ×2 IMPLANT
TRAY FOLEY CATH 16FRSI W/METER (SET/KITS/TRAYS/PACK) ×2 IMPLANT
TUBE CONNECTING 20X1/4 (TUBING) ×1 IMPLANT
WATER STERILE IRR 1000ML POUR (IV SOLUTION) ×2 IMPLANT
YANKAUER SUCT BULB TIP NO VENT (SUCTIONS) ×2 IMPLANT

## 2015-11-11 NOTE — Brief Op Note (Signed)
PATIENT ID:      Philip NorfolkLarry J Peters  MRN:     [id number] DOB/AGE:    [birth year] / 60 y.o.       OPERATIVE REPORT   DATE OF PROCEDURE:  11/11/2015      PREOPERATIVE DIAGNOSIS:   L4-5 spondylolisthesis with foraminal entrapment, foraminal stenosis L5-S1                                                       There is no weight on file to calculate BMI.    POSTOPERATIVE DIAGNOSIS:   * No post-op diagnosis entered *                                                                     There is no weight on file to calculate BMI.    PROCEDURE:  Procedure(s): TRANSFORAMINAL LUMBAR INTERBODY FUSIONS L4-5 AND L5-S1 WITH CAGES, PEDICLE SCREWS AND RODS    SURGEON: NITKA,JAMES E   ASSISTANT: Andee LinemanJames Owens,PA-C          ANESTHESIA:  General and supplemented with local marcaine 1/2% 1:1 exparel 1.3% total  30cc. Dr. Judie Petitharlene Edwards.  EBL:700cc  CELL SAVER RETURNED: 285CC  DRAINS:Foley to SD.   COMPLICATIONS:  None   CONDITION:  Stable, extubated and in satisfactory condition    NITKA,JAMES E 11/11/2015, 7:50 AM

## 2015-11-11 NOTE — Interval H&P Note (Signed)
Patient was seen and examined in the preop holding area. There has been no interval  Change in this patient's exam preop  history and physical exam  Lab tests and images have been examined and reviewed.  The Risks benefits and alternative treatments have been discussed  extensively,questions answered.  The patient has elected to undergo the discussed surgical treatment. 

## 2015-11-11 NOTE — Anesthesia Procedure Notes (Signed)
Procedure Name: Intubation Date/Time: 11/11/2015 7:32 AM Performed by: Kyung Rudd Pre-anesthesia Checklist: Patient identified, Emergency Drugs available, Suction available, Patient being monitored and Timeout performed Patient Re-evaluated:Patient Re-evaluated prior to inductionPreoxygenation: Pre-oxygenation with 100% oxygen Intubation Type: IV induction Ventilation: Mask ventilation without difficulty Laryngoscope Size: Mac and 4 Grade View: Grade I Tube type: Oral Tube size: 7.5 mm Number of attempts: 1 Airway Equipment and Method: Stylet and LTA kit utilized Placement Confirmation: ETT inserted through vocal cords under direct vision,  positive ETCO2 and breath sounds checked- equal and bilateral Secured at: 22 cm Tube secured with: Tape Dental Injury: Teeth and Oropharynx as per pre-operative assessment

## 2015-11-11 NOTE — Progress Notes (Signed)
Patient arrived to 5C03 from PACU. Safety precautions and orders reviewed with pt. ICS encourage. VSS. Patient noted relaxed, denied pain at this time, family at bedside. No other distress noted. Will continue to monitor,   Sim BoastHavy, RN

## 2015-11-11 NOTE — Anesthesia Preprocedure Evaluation (Addendum)
Anesthesia Evaluation  Patient identified by MRN, date of birth, ID band Patient awake    Reviewed: Allergy & Precautions, NPO status , Patient's Chart, lab work & pertinent test results  Airway Mallampati: II  TM Distance: >3 FB Neck ROM: Full    Dental  (+) Missing,    Pulmonary former smoker,    breath sounds clear to auscultation       Cardiovascular  Rhythm:Regular     Neuro/Psych    GI/Hepatic   Endo/Other    Renal/GU      Musculoskeletal   Abdominal   Peds  Hematology   Anesthesia Other Findings   Reproductive/Obstetrics                           Anesthesia Physical Anesthesia Plan  ASA: III  Anesthesia Plan: General   Post-op Pain Management:    Induction: Intravenous  Airway Management Planned: Oral ETT  Additional Equipment:   Intra-op Plan:   Post-operative Plan: Extubation in OR  Informed Consent: I have reviewed the patients History and Physical, chart, labs and discussed the procedure including the risks, benefits and alternatives for the proposed anesthesia with the patient or authorized representative who has indicated his/her understanding and acceptance.   Dental advisory given  Plan Discussed with:   Anesthesia Plan Comments:         Anesthesia Quick Evaluation

## 2015-11-11 NOTE — Discharge Instructions (Addendum)
° ° °  Call if there is increasing drainage, fever greater than 101.5, severe head aches, and worsening nausea or light sensitivity. If shortness of breath, bloody cough or chest tightness or pain go to an emergency room. No lifting greater than 10 lbs. Avoid bending, stooping and twisting. Use brace when sitting and out of bed even to go to bathroom. Walk in house for first 2 weeks then may start to get out slowly increasing distances up to one quarter mile by 4-6 weeks post op. After 3 days may shower and change dressing following bathing with shower.When bathing remove the brace shower and replace brace before getting out of the shower. If drainage, keep dry dressing and do not bathe the incision, use an moisture impervious dressing. Please call and return for scheduled follow up appointment 2 weeks from the time of surgery.

## 2015-11-11 NOTE — Op Note (Signed)
11/11/2015  1:54 PM  PATIENT:  Philip Peters  60 y.o. male  MRN: [id number]  OPERATIVE REPORT  PRE-OPERATIVE DIAGNOSIS:  L4-5 spondylolisthesis with foraminal entrapment, foraminal stenosis L5-S1  POST-OPERATIVE DIAGNOSIS:  * No post-op diagnosis entered *  PROCEDURE:  Procedure(s): TRANSFORAMINAL LUMBAR INTERBODY FUSIONS L4-5 AND L5-S1 WITH CAGES, PEDICLE SCREWS AND RODS    SURGEON:  Jessy Oto, MD     ASSISTANT:  Benjiman Core, PA-C  (Present throughout the entire procedure and necessary for completion of procedure in a timely manner)     ANESTHESIA:  General,supplemented with local marcaine 1/2% 1:1 exparel 1.3% total 30cc, Dr. Finis Bud.  EBL: 700CC  CELL SAVER RETURNED: 494 CC    COMPLICATIONS:  None.     COMPONENTS   Implant Name Type Inv. Item Serial No. Manufacturer Lot No. LRB No. Used  BONE VIVIGEN FORMABLE 5.4CC - [id number] Bone Implant BONE VIVIGEN FORMABLE 5.4CC [id number] [facility]  N/A 1  BONE CANC CHIPS 20CC - [id number] Bone Implant BONE CANC CHIPS 20CC [id number] [facility]  N/A 1  T-PAL 41m Cage    DEPUY SPINE  N/A 1  SCREW CORTICAL VIPER 7X40MM - [id number] SCREW CORTICAL VIPER 7X40MM  DEPUY SPINE  N/A 2  SCREW CORTICAL VIPER 7X35 - [id number] SCREW CORTICAL VIPER 7X35  DEPUY SPINE  N/A 2  ROD EXPEDIUM PER BENT 65MM - [id number] ROD EXPEDIUM PER BENT 65MM  DEPUY SPINE  N/A 2  Depuy Viper Cortical Screw 779mx 4512m  DEPUY SPINE  N/A 2  ROD EXPEDIUM PRE BENT 5.5X75 - [id number] ROD EXPEDIUM PRE BENT 5.5X75  DEPUY SPINE  N/A 2  SCREW SET SINGLE INNER - [id number] SCREW SET SINGLE INNER  DEPUY SPINE  N/A 7  CAGE CONCORDE BULLET 9X8X27MM - [id number] CAGE CONCORDE BULLET 9X8X27MM   DEPUY SPINE   N/A 1  :   PROCEDURE: The patient was met in the holding area, and the appropriate lumbar levels left L4-5 and L5-S1 identified and marked with an "X" and my initials. I had discussion  with the patient in the preop holding area regarding consent form.The fusion levels are identified as L4-5 and L5-S1. Patient understands the rationale to perform TLIFs at two levels to decompress the left L4-5 and L5-S1 lateral recess and foramenal stenosis and to allow for perservation of lordosis. The patient was then transported to OR and was placed under general anestheticwithout difficulty. The patient received appropriate preoperative antibiotic prophylaxis 2 gm Ancef.  Nursing staff inserted a Foley catheter under sterile conditions. The patient was then turned to a prone position using the JacFranklinine frame. PAS. all pressure points well padded the arms at the side to 90 90. Standard prep with DuraPrep solution draped in the usual manner from the lower dorsal spine the mid sacral segment. Iodine Vi-Drape was used and the old incision scar was marked. Time-out procedure was called and correct. Skin in the midline between L3and S2 was then infiltrated with local anesthesia, marcaine 1/2% 1:1 exparel 1.3% total 20 cc used. Incision was then made  extending from L3-S2  through the skin and subcutaneous layers down to the patient's lumbodorsal fascia and spinous processes. The incision then carried sharply excising the supraspinous ligament and then continuing the lateral aspect of the spinous processes of L3, L4, L5 and S1. Cobb elevator used to carefully elevate the paralumbar muscles off of the posterior elements using electrocautery carefully  drilled bleeding and perform dissection of the muscle tissues of the preserving the facet capsule at the L3-4. Continuing the exposure out laterally to expose the lateral margin of the facet joint line at L3-4, L4-5 and L5-S1. Incision was carried in the midline down to the S1 level area bleeders controlled using electrocautery monopolar electrocautery.  Viper self retaining retractor was used for the upper part of the incision and the cerebellar retractor  distally. C-arm fluoroscopy was then brought into the field and using C-arm fluoroscopy then a hole made into the medial aspect of the pedicle of L4 observed in the pedicle using C arm at the 5 oclock position on the left L4 pedicle nerve probe initial entry was determined on fluoroscopy to be good position alignment so that a 4.72m tap was passed to 30 mm within the left L4 pedicle to a depth of nearly 40 mm observed on C-arm fluoroscopy to be beyond the midpoint of the lumbar vertebra and then position alignment within the left L4 pedicle this was then removed and the pedicle channel probed demonstrating patency no sign of rupture the cortex of the pedicle. Tapping with a 4 mm screw tap then 5 mm tap then 5.0 mm x 40 mm screw was placed on the left side at the L4 level. C-arm fluoroscopy was then brought into the field and using C-arm fluoroscopy then a hole made into the posterior medial aspect of the pedicle of right L4 observed in the pedicle using ball tipped nerve hook and hockey stick nerve probe initial entry was determined on fluoroscopy to be good position alignment so that 4.03mtap was then used to tap the right L4 pedicle to a depth of nearly 40 mm observed on C-arm fluoroscopy to be beyond the midpoint of the lumbar vertebra and then position alignment within the right L4 pedicle this was then removed and the pedicle channel probed demonstrating patency no sign of rupture the cortex of the pedicle. Tapping with a 5 mm screw tap, 6 mm tap and then a 31m83map then 7.0 mm x 40 mm screw was placed at the left L4 pedicle level. C-arm fluoroscopy was then brought into the field and using C-arm fluoroscopy then a hole made into the posterior and medial aspect of the right pedicle of L4 observed in the pedicle using ball tipped nerve hook and hockey stick nerve probe initial entry was determined on fluoroscopy to be good position alignment so that a 4.0mm81mp was then used to tap the right L4 pedicle to a  depth of nearly 40 mm observed on C-arm fluoroscopy to be beyond the posterior one third of the lumbar vertebra and good position alignment within the right L4 pedicle this was then removed and the pedicle channel probed demonstrating patency no sign of rupture the cortex of the pedicle. Tapping with a 5 mm screw tap, then 6.0mm 33m and then 7.0 mm tap, then 7.0mm x36m mm screw was placed on the right side at the L4 level. The pedicle channel of L4 on the right probed demonstrating patency no sign of rupture the cortex of the pedicle. Viper screw for fixation of this level was measured as 7.0 mm x 40 mm screw so  was placed on the right side at the L4 level. C-arm fluoroscopy was then brought into the field and using C-arm fluoroscopy then a hole made into the posterior and medial aspect of the left pedicle of L5 observed in the pedicle using ball tipped  nerve hook and hockey stick nerve probe initial entry was determined on fluoroscopy to be good position alignment so that a 4.27m tap was then used to tap the left L5 pedicle to a depth of nearly 45 mm observed on C-arm fluoroscopy to be beyond the posterior one third of the lumbar vertebra and good position alignment within the left L5 pedicle this was then removed and the pedicle channel probed demonstrating patency no sign of rupture the cortex of the pedicle. Tapping with a 5 mm screw tap, then a 6.034mtap and then a 7.52m152map, then 7.52mm37m45 mm screw was placed on the left side at the L5 level. The pedicle channel of L5 on the left probed demonstrating patency no sign of rupture the cortex of the pedicle. Viper screw for fixation of this level was measured as 7.0 mm x 45 mm screw and this was insert into the left L5 pedicle.  C-arm fluoroscopy was then brought into the field and using C-arm fluoroscopy then a hole made into the posterior and medial aspect of the right pedicle of L5 observed in the pedicle using ball tipped nerve hook and hockey stick nerve  probe initial entry was determined on fluoroscopy to be good position alignment so that a 4.35mm72m was then used to tap the right L5 pedicle to a depth of nearly 45 mm observed on C-arm fluoroscopy to be beyond the posterior one third of the lumbar vertebra and good position alignment within the right L5 pedicle this was then removed and the pedicle channel probed demonstrating patency no sign of rupture the cortex of the pedicle. Tapping with a 5 mm screw tap, then a 6.52mm t45mand then a 7.52mm ta44mthen 7.52mm x 417mm screw was placed on the right side at the L5 level. The pedicle channel of L5 on the right probed demonstrating patency no sign of rupture the cortex of the pedicle. Viper screw for fixation of this level was measured as 7.0 mm x 45 mm screw and this was insert into the right L5 pedicle. C-arm fluoroscopy was then brought into the field and using C-arm fluoroscopy then the hole into the pedicle of left S1 was placed and observed with ball-tipped probe the S1 pedicle on this side was 7.0 mm x 35 mm. A 7.52mm X [id number] passed down the center of the S1 pedicle to a depth of nearly 35 mm. Observed on C-arm fluoroscopy to be in good position alignment channel was probed with a ball-tipped probe ensure patency no sign of cortical disruption. Following tapping with a 5 mm tap, and a 6.52mm tap an59mhen a 7.52mm tap, a 71m x 35 mm screw was placed on the left side pedicle at S1. C-arm fluoroscopy was used to localize the hole made in the medial aspect of the pedicle of S1 on the right localizing the pedicle within the spinal canal with nerve hook and hockey-stick nerve probe carefully passed down the center of the S1 pedicle to a depth of nearly 35 mm. Observed on C-arm fluoroscopy to be in good position alignment channel was probed with a ball-tipped probe ensure predicle screw  to be placed on the right side at the S1 level following decompression and TLIFs..C-arm fluoroscopy was used to localize the  hole made in the lateral aspect of the pedicle of S1 on the right localizing the pedicle within the spinal canal with nerve hook and hockey-stick nerve probe re patency no sign of cortical disruption.  Following tapping with a 4 mm, 58m, 6 mm and 7.013mtaps a 7.0 x 35 mm screw was inserted into the right S1 pedicle.  OR microscope sterilely draped and brought into the field.Leksell rongeur used to resect inferior aspect of the lamina on the left side at the L4 level.The left medial 70% of the facet of L4-5 and L5-S1 were resected in order to decompress the left side of the lumbar thecal sac at  L4-5 and L5-S1 and decompress the left L4 and L5 and S1 neuroforamen. Osteotomes and 58m14mnd 3mm67mrrisons were used for this portion of the decompression. Decompression was carried out with complete facetectomies were perform on the left at L4-5 and L5-S1 to provide for exposure of the left side L4-5 and L5-S1 neuroforamen for ease of placement of TLIFs (transforaminal lumbar interbody fusion) at each level inferior portions of the lamina and pars were also resected first beginning with the Leksell rongeur and osteotomes and then resecting using 2 and 3 mm Kerrison.Foraminotomies on the left side at the L4, L5 and S1levels. The inferior articular process L4 and L5 were resected on the left side. The L5 nerve root identified on the left side over the medial aspect of the L5 pedicle. Superior articular process of L5 was then resected from the left side further decompressing the left L5 nerve and providing for exposure of the area just superior to the L5 pedicle for a placement of cage.A large amount of hypertrophic ligmentum flavum was found impressing on the left lateral recesses at L4-5 and L5-S1 and narrowing the respective L4, L5 and S1neuroforamen. teral aspect of the thecal sac at the L4-5 disc space was carefully freed up The thecal sac could then easily be retracted. Attention then turned to placement of the  transforaminal lumbar interbody fusion cages. Using a Penfield 4 the left posterior and  posterior lateral aspect of the L4-5 disc was exposed 15 blade scalpel used to incise the posterolateral disc and an osteotome used to resect a small portion of bone off the superior aspect of the posterior superior vertebral body of L5 in order to ease the entry into the left L4-5 disc space. A  58mm 73mrison rongeur was then able to be introduced in the disc space debrided it was quite narrow. 7 mm dilator shaver was used to dialate the L4-5 disc space on the right side attempts were made to dilate further to the 12 mm were successful and using small curettes and the disc space was debrided a minimal degenerative disc present in the endplates debrided to bleeding endplate bone. Shavers were inserted to trial the intervertebral disc space. A 11 mm x 28mm 44mhes T-PAL cage was carefully packed with morcellized bone graft that had been harvested from previous laminotomies and vivigen II.The cage was then inserted with the articulating insertion handle.  Additional bone graft was then packed into the intervertebral disc space. Bleeding controlled using bipolar electrocautery thrombin soaked gel cottonoids. Then turned to the left L5-S1 level similarly the exposure the posterior lateral aspect this was carried out using a Penfield 4 bipolar electrocautery to control small bleeders present. Derricho retractor used to retract the thecal sac and L5 nerve root a 15 blade scalpel was used to incise posterior lateral aspect of the left L5-S1disc the disc space at this level showed a rather severe narrowing posteriorly was more open anteriorly so that an osteotome again was used to resect a small portion the posterior superior lip of the vertebral body  at S1  in order to gain ease of access into the L5-S1 disc space. The space was debrided of degenerative disc material using pituitary along root the entire disc space was then debrided of  degenerative disc material using pituitary rongeurs curettage down to bleeding bone endplates. 61m and 8 mm shavers were used to debride the disc space and pituitary ronguers used to remove the loosened debris. This space was then carefully assess using spacers  a 8.059mtrial concord cage provided the best fit, the [facility]otic cage 44m33m 244m35m chosen so that the permanent 8.0 mm cage by 28 mm lordotic concord cage packed with local bone graft, allograft cancellous chips and vivigen II was placed into the intervertebral disc space. The posterior intervertebral disc space was then packed with autogenous local bone graft that been harvested from the central laminectomy and well as cancelous allograft . Bleeding controlled using bipolar electrocautery.  Observed on C-arm fluoroscopy to be in good position alignment. The cages at L4-5 and L5-S1 were placed anteriorly as best as possible the lordosis of the lumbar spine was preserved.  With this then the transforaminal lumbar interbody fusion portion of the case was completed bleeders were controlled using bipolar electrocautery thrombin-soaked Gelfoam were appropriate.Decortication of the facet joints carried out bilateral L4-5 and L5-S1. These were packed with cancellous local bone graft. The right posterior lamina were decorticated from L4 to S1. Each of the pedicle screw fasteners carefully aligned  to allow for placement of rods. The right side first quarter inch titanium rod was then carefully contoured using the french benders and a precontoured 75mm71m. This was then placed into the pedicle screws on the right extending from L4-S1 each of the caps were carefully placed loosely tightened. Attention turned to the left side were similarly and then screws were carefully adjusted to allow for a better pattern screws to allow for placement of fixation of the rod a quarter inch 75 mm precontoured titanium rod was then carefully place after an intial placement of  65 mm rods that were found to be 1 mm- 2 mm too short . This was able to be inserted into the left pedicle screw fasteners, Caps onto the L4 fasteners were tightened to 80 foot lbs. Across the left side  L4-5 and L5-S1 screw fasteners compression was obtained on the right side between L4 and L5, then L5 and S1 by compressing between the fasteners and tightening the screw caps 85 pounds. Similarly this was done on the right side at L4-5 and L5-S1 obtaining compression and tightened 85 pounds. Irrigation was carried out with copious amounts of saline solution this was done throughout the case. Cell Saver was used during the case.  Hockey stick neuroprobe was used to probe the neuroforamen bilateral L4, L5 and S1, these were determined to be well decompressed. Permanent C-arm images were obtained in AP and lateral plane and oblique planes. Remaining local bone graft, cancellous chips and vivigen II was then applied along both right lateral posterior and posteror region extending from L4 toS1 facet beds.Gelfoam was then removed spinal canal the lumbodorsal musculature carefully exam debrided of any devitalized tissue following removal of Viper retractors were the bleeders were controlled using electrocautery and the area dorsal lumbar muscle were then approximated in the midline with interrupted #1 Vicryl sutures loose the dorsal fascia was reattached to the spinous process of L3 to superiorly and S1 inferiorly this was done with #1 Vicryl sutures. Subcutaneous layers then approximated using interrupted  0 Vicryl sutures and 2-0 Vicryl sutures. Skin was closed with a running subcutaneous stitch of 4-0 Vicryl Dermabond was applied then MedPlex bandage. All instrument and sponge counts were correct. The patient was then returned to a supine position on her bed reactivated extubated and returned to the recovery room in satisfactory condition. Intraoperative neuromonitoring was carried out during the case with soft tissue  resistance monitored and tested at each screw placed. The resistance For each screw being left L4 31, right L4 26, left L5 23, right L5 16, left S1 32 and the right S1 19. The motor evoked Potentials remained normal and unchanged throughout the case.   Benjiman Core, PA-C perform the duties of assistant surgeon during this case. He was present from the beginning of the case to the end of the case assisting in transfer the patient from his stretcher to the OR table and back to the stretcher at the end of the case. Assisted in careful retraction and suction of the laminectomy site delicate neural structures operating under the operating room microscope. He performed closure of the incision from the fascia to the skin applying the dressing.         NITKA,JAMES E  11/11/2015, 1:54 PM

## 2015-11-11 NOTE — Transfer of Care (Signed)
Immediate Anesthesia Transfer of Care Note  Patient: Glena NorfolkLarry J Rudnicki  Procedure(s) Performed: Procedure(s): TRANSFORAMINAL LUMBAR INTERBODY FUSIONS L4-5 AND L5-S1 WITH CAGES, PEDICLE SCREWS AND RODS (N/A)  Patient Location: PACU  Anesthesia Type:General  Level of Consciousness: sedated  Airway & Oxygen Therapy: Patient Spontanous Breathing and Patient connected to face mask oxygen  Post-op Assessment: Report given to RN, Post -op Vital signs reviewed and stable and Patient moving all extremities  Post vital signs: Reviewed and stable  Last Vitals:  Filed Vitals:   11/11/15 0644  BP: 133/86  Pulse: 62  Temp: 36.8 C  Resp: 18    Last Pain:  Filed Vitals:   11/11/15 0651  PainSc: 1          Complications: No apparent anesthesia complications

## 2015-11-11 NOTE — Anesthesia Postprocedure Evaluation (Signed)
Anesthesia Post Note  Patient: Philip NorfolkLarry J Peters  Procedure(s) Performed: Procedure(s) (LRB): TRANSFORAMINAL LUMBAR INTERBODY FUSIONS L4-5 AND L5-S1 WITH CAGES, PEDICLE SCREWS AND RODS (N/A)  Patient location during evaluation: PACU Anesthesia Type: General Level of consciousness: awake, oriented and awake and alert Pain management: pain level controlled Vital Signs Assessment: post-procedure vital signs reviewed and stable Respiratory status: spontaneous breathing, respiratory function stable and nonlabored ventilation Cardiovascular status: blood pressure returned to baseline Anesthetic complications: no    Last Vitals:  Filed Vitals:   11/11/15 1529 11/11/15 1545  BP: 124/86 127/85  Pulse: 81 86  Temp:    Resp: 14 14    Last Pain:  Filed Vitals:   11/11/15 1624  PainSc: 5                  Reggie Welge COKER

## 2015-11-12 LAB — BASIC METABOLIC PANEL
ANION GAP: 4 — AB (ref 5–15)
BUN: 13 mg/dL (ref 6–20)
CALCIUM: 8 mg/dL — AB (ref 8.9–10.3)
CO2: 27 mmol/L (ref 22–32)
CREATININE: 1.35 mg/dL — AB (ref 0.61–1.24)
Chloride: 106 mmol/L (ref 101–111)
GFR, EST NON AFRICAN AMERICAN: 55 mL/min — AB (ref >60)
Glucose, Bld: 106 mg/dL — ABNORMAL HIGH (ref 65–99)
Potassium: 3.8 mmol/L (ref 3.5–5.1)
SODIUM: 137 mmol/L (ref 135–145)

## 2015-11-12 LAB — CBC
HEMATOCRIT: 40.4 % (ref 39.0–52.0)
Hemoglobin: 12.8 g/dL — ABNORMAL LOW (ref 13.0–17.0)
MCH: 28.6 pg (ref 26.0–34.0)
MCHC: 31.7 g/dL (ref 30.0–36.0)
MCV: 90.2 fL (ref 78.0–100.0)
PLATELETS: 132 10*3/uL — AB (ref 150–400)
RBC: 4.48 MIL/uL (ref 4.22–5.81)
RDW: 13.1 % (ref 11.5–15.5)
WBC: 6.2 10*3/uL (ref 4.0–10.5)

## 2015-11-12 MED FILL — Thrombin For Soln 20000 Unit: CUTANEOUS | Qty: 1 | Status: AC

## 2015-11-12 NOTE — Progress Notes (Signed)
Patient refused enema.

## 2015-11-12 NOTE — Progress Notes (Signed)
Occupational Therapy Treatment Patient Details Name: SAMARION EHLE MRN: [id number] DOB: Nov 22, 1955 Today's Date: 11/12/2015    History of present illness 60 y.o. male with spondylolisthesis, spinal stenosis admitted for TLIF L4-5, L5-S1.    OT comments  Pt progressed and met all goals. Pt without further questions. All education is complete and patient indicates understanding. Pt will have wife support upon d/c home.   Follow Up Recommendations  No OT follow up    Equipment Recommendations  3 in 1 bedside comode    Recommendations for Other Services      Precautions / Restrictions Precautions Precautions: Back Required Braces or Orthoses: Spinal Brace Spinal Brace: Thoracolumbosacral orthotic;Applied in sitting position Restrictions Weight Bearing Restrictions: No       Mobility Bed Mobility Overal bed mobility: Needs Assistance Bed Mobility: Sit to Supine Rolling: Min assist Sidelying to sit: Supervision       General bed mobility comments: (A) for R LE lift onto bed surface. Educate on positioning and sequence.   Transfers Overall transfer level: Needs assistance Equipment used: Rolling walker (2 wheeled) Transfers: Sit to/from Stand Sit to Stand: Modified independent (Device/Increase time)         General transfer comment: able to complete with arm rest support    Balance Overall balance assessment: Needs assistance         Standing balance support: Bilateral upper extremity supported;During functional activity Standing balance-Leahy Scale: Good                     ADL Overall ADL's : Needs assistance/impaired Eating/Feeding: Modified independent   Grooming: Wash/dry hands;Wash/dry face;Oral care;Min guard       Lower Body Bathing: Total assistance       Lower Body Dressing: Modified independent;Cueing for back precautions;With adaptive equipment Lower Body Dressing Details (indicate cue type and reason): able to use AE and pts wife  plans to purchase Toilet Transfer: Modified Independent Toilet Transfer Details (indicate cue type and reason): requires use of bIL UE on arm rest .pt able to power up into standing         Functional mobility during ADLs: Supervision/safety General ADL Comments: Pt educated on back precautions for all adls. Pt demonstrates deficits with LB> OT to return and educate on AE      Vision                     Perception     Praxis      Cognition   Behavior During Therapy: [facility] for tasks assessed/performed Overall Cognitive Status: Within Functional Limits for tasks assessed                       Extremity/Trunk Assessment  Upper Extremity Assessment Upper Extremity Assessment: Overall WFL for tasks assessed   Lower Extremity Assessment Lower Extremity Assessment: Defer to PT evaluation LLE Deficits / Details: knee ext 4/5, sensation intact to light touch (pt reported radicular pain LLE prior to surgery, none during PT eval)   Cervical / Trunk Assessment Cervical / Trunk Assessment: Other exceptions (s/p surg)    Exercises     Shoulder Instructions       General Comments      Pertinent Vitals/ Pain       Pain Assessment: Faces Pain Score: 5  Faces Pain Scale: Hurts a little bit Pain Location: operative Pain Descriptors / Indicators: Operative site guarding Pain Intervention(s): Monitored during session  Home Living Family/patient  expects to be discharged to:: Private residence Living Arrangements: Spouse/significant other Available Help at Discharge: Family;Available 24 hours/day   Home Access: Stairs to enter [employer] of Steps: 4 Entrance Stairs-Rails: Left Home Layout: One level     Bathroom Shower/Tub: Tub/shower unit;Walk-in shower   Bathroom Toilet: Handicapped height     Home Equipment: None          Prior Functioning/Environment Level of Independence: Independent            Frequency Min 2X/week      Progress Toward Goals  OT Goals(current goals can now be found in the care plan section)  Progress towards OT goals: Goals met/education completed, patient discharged from OT  Acute Rehab OT Goals Patient Stated Goal: to travel to [place] OT Goal Formulation: With patient Potential to Achieve Goals: Good ADL Goals Pt Will Perform Lower Body Dressing: with modified independence;sit to/from stand;with adaptive equipment Pt Will Transfer to Toilet: with modified independence;bedside commode  Plan Discharge plan remains appropriate    Co-evaluation                 End of Session Equipment Utilized During Treatment: Gait belt;Rolling walker;Back brace   Activity Tolerance Patient tolerated treatment well   Patient Left in bed;with call bell/phone within reach;with bed alarm set;with SCD's reapplied;with family/visitor present   Nurse Communication Mobility status;Precautions        Time: 7903-8333 OT Time Calculation (min): 42 min  Charges: OT General Charges $OT Visit: 1 Procedure OT Evaluation $OT Eval Moderate Complexity: 1 Procedure OT Treatments $Self Care/Home Management : 38-52 mins  Parke Poisson B 11/12/2015, 3:56 PM   Jeri Modena   OTR/L Pager: [phone] Office: [phone] .

## 2015-11-12 NOTE — Progress Notes (Signed)
Orthopedic Tech Progress Note Patient Details:  Philip Peters [birth year] [id number]  Patient ID: Philip Peters, male   DOB: [birth year], 60 y.o.   MRN: [id number] Called in bio-tech brace order; spoke with Anderson MaltaStephanie  Revia Nghiem 11/12/2015, 9:07 AM

## 2015-11-12 NOTE — Evaluation (Signed)
Occupational Therapy Evaluation Patient Details Name: Philip Peters MRN: [id number] DOB: [birth year] Today's Date: 11/12/2015    History of Present Illness 60 y.o. male with spondylolisthesis, spinal stenosis admitted for TLIF L4-5, L5-S1.    Clinical Impression   Patient is s/p see above surgery resulting in functional limitations due to the deficits listed below (see OT problem list). PTA was total (A) for LB adls due to inability to reach socks. Pt wife (A).  Patient will benefit from skilled OT acutely to increase independence and safety with ADLS to allow discharge home.     Follow Up Recommendations  No OT follow up    Equipment Recommendations  3 in 1 bedside comode    Recommendations for Other Services       Precautions / Restrictions Precautions Precautions: Back Required Braces or Orthoses: Spinal Brace Spinal Brace: Thoracolumbosacral orthotic;Applied in sitting position Restrictions Weight Bearing Restrictions: No      Mobility Bed Mobility Overal bed mobility: Needs Assistance Bed Mobility: Rolling;Sidelying to Sit Rolling: Min assist Sidelying to sit: Supervision       General bed mobility comments: instructed pt in log roll, increased time, 5/10 pain with bed mobility; assist to don back brace  Transfers Overall transfer level: Needs assistance Equipment used: Rolling walker (2 wheeled) Transfers: Sit to/from Stand Sit to Stand: Mod assist         General transfer comment: unable to transfer without use of bil UE . Pt will require 3n1 for home    Balance Overall balance assessment: Needs assistance         Standing balance support: Bilateral upper extremity supported;During functional activity Standing balance-Leahy Scale: Good                              ADL Overall ADL's : Needs assistance/impaired Eating/Feeding: Modified independent   Grooming: Wash/dry hands;Wash/dry face;Oral care;Min guard       Lower Body  Bathing: Total assistance           Toilet Transfer: Moderate assistance           Functional mobility during ADLs: Supervision/safety General ADL Comments: Pt educated on back precautions for all adls. Pt demonstrates deficits with LB> OT to return and educate on AE     Vision     Perception     Praxis      Pertinent Vitals/Pain Pain Assessment: Faces Pain Score: 5  Faces Pain Scale: Hurts a little bit Pain Location: operative site Pain Descriptors / Indicators: Operative site guarding Pain Intervention(s): Monitored during session;Premedicated before session;Repositioned     Hand Dominance Right   Extremity/Trunk Assessment Upper Extremity Assessment Upper Extremity Assessment: Overall WFL for tasks assessed   Lower Extremity Assessment Lower Extremity Assessment: Defer to PT evaluation LLE Deficits / Details: knee ext 4/5, sensation intact to light touch (pt reported radicular pain LLE prior to surgery, none during PT eval)   Cervical / Trunk Assessment Cervical / Trunk Assessment: Other exceptions (s/p surg)   Communication Communication Communication: No difficulties   Cognition Arousal/Alertness: Awake/alert Behavior During Therapy: WFL for tasks assessed/performed Overall Cognitive Status: Within Functional Limits for tasks assessed                     General Comments       Exercises       Shoulder Instructions      Home Living Family/patient expects to be discharged  to:: Private residence Living Arrangements: Spouse/significant other Available Help at Discharge: Family;Available 24 hours/day   Home Access: Stairs to enter [employer] of Steps: 4 Entrance Stairs-Rails: Left Home Layout: One level     Bathroom Shower/Tub: Tub/shower unit;Walk-in shower   Bathroom Toilet: Handicapped height     Home Equipment: None          Prior Functioning/Environment Level of Independence: Independent             OT  Diagnosis: Generalized weakness;Acute pain   OT Problem List: Decreased strength;Decreased activity tolerance;Impaired balance (sitting and/or standing);Decreased safety awareness;Decreased knowledge of use of DME or AE;Decreased knowledge of precautions;Pain   OT Treatment/Interventions: Self-care/ADL training;Therapeutic exercise;DME and/or AE instruction;Therapeutic activities;Patient/family education;Balance training    OT Goals(Current goals can be found in the care plan section) Acute Rehab OT Goals 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 OT Goal Formulation: With patient Potential to Achieve Goals: Good  OT Frequency: Min 2X/week   Barriers to D/C:            Co-evaluation              End of Session Equipment Utilized During Treatment: Gait belt;Rolling walker;Back brace Nurse Communication: Mobility status;Precautions  Activity Tolerance: Patient tolerated treatment well Patient left: in chair;with call bell/phone within reach;with family/visitor present   Time: 1340-1400 OT Time Calculation (min): 20 min Charges:  OT General Charges $OT Visit: 1 Procedure OT Evaluation $OT Eval Moderate Complexity: 1 Procedure G-Codes:    Boone Master B November 19, 2015, 3:52 PM   Mateo Flow   OTR/L Pager: [phone] Office: [phone] .

## 2015-11-12 NOTE — Evaluation (Signed)
Physical Therapy Evaluation Patient Details Name: Philip Peters MRN: [id number] DOB: 06/07/1955 Today's Date: 11/12/2015   History of Present Illness  60 y.o. male with spondylolisthesis, spinal stenosis admitted for TLIF L4-5, L5-S1.   Clinical Impression  Patient is s/p above surgery resulting in the deficits listed below (see PT Problem List). Pt ambulated 1945' with RW, distance limited by dizziness. Instructed pt in back precautions. Encouraged frequent ambulation. Good progress expected.  Patient will benefit from skilled PT to increase their independence and safety with mobility (while adhering to their precautions) to allow discharge to the venue listed below.     Follow Up Recommendations Home health PT;Supervision for mobility/OOB    Equipment Recommendations  Rolling walker with 5" wheels;3in1 (PT)    Recommendations for Other Services OT consult     Precautions / Restrictions Precautions Precautions: Back Required Braces or Orthoses: Spinal Brace Spinal Brace: Thoracolumbosacral orthotic;Applied in sitting position Restrictions Weight Bearing Restrictions: No      Mobility  Bed Mobility Overal bed mobility: Needs Assistance Bed Mobility: Rolling;Sidelying to Sit Rolling: Min assist Sidelying to sit: Supervision       General bed mobility comments: instructed pt in log roll, increased time, 5/10 pain with bed mobility; assist to don back brace  Transfers Overall transfer level: Needs assistance Equipment used: Rolling walker (2 wheeled) Transfers: Sit to/from Stand Sit to Stand: Min guard;From elevated surface         General transfer comment: no physical assist, from raised bed, VCs to avoid trunk flexion  Ambulation/Gait Ambulation/Gait assistance: Min guard Ambulation Distance (Feet): 45 Feet Assistive device: Rolling walker (2 wheeled) Gait Pattern/deviations: Step-through pattern;Decreased step length - right;Decreased step length - left   Gait  velocity interpretation: Below normal speed for age/gender General Gait Details: increased time, steady with RW, distance limited by dizziness (resolved with sitting -no dynamap available to assess vitals)  Stairs            Wheelchair Mobility    Modified Rankin (Stroke Patients Only)       Balance Overall balance assessment: Modified Independent                                           Pertinent Vitals/Pain Pain Assessment: 0-10 Pain Score: 5  Pain Location: with sidelying to sit, 0/10 with walking Pain Descriptors / Indicators: Sore Pain Intervention(s): Limited activity within patient's tolerance;Monitored during session;Premedicated before session;Repositioned    Home Living Family/patient expects to be discharged to:: Private residence Living Arrangements: Spouse/significant other Available Help at Discharge: Family;Available 24 hours/day   Home Access: Stairs to enter Entrance Stairs-Rails: Left Entrance Stairs-Number of Steps: 4 Home Layout: One level Home Equipment: None      Prior Function Level of Independence: Independent               Hand Dominance        Extremity/Trunk Assessment   Upper Extremity Assessment: Overall WFL for tasks assessed           Lower Extremity Assessment: LLE deficits/detail   LLE Deficits / Details: knee ext 4/5, sensation intact to light touch (pt reported radicular pain LLE prior to surgery, none during PT eval)  Cervical / Trunk Assessment: Normal  Communication   Communication: No difficulties  Cognition Arousal/Alertness: Awake/alert Behavior During Therapy: WFL for tasks assessed/performed Overall Cognitive Status: Within Functional Limits for tasks  assessed                      General Comments      Exercises        Assessment/Plan    PT Assessment Patient needs continued PT services  PT Diagnosis Difficulty walking;Acute pain   PT Problem List Decreased  strength;Decreased activity tolerance;Pain;Decreased knowledge of use of DME;Decreased mobility;Decreased knowledge of precautions  PT Treatment Interventions DME instruction;Gait training;Stair training;Functional mobility training;Therapeutic activities;Patient/family education;Balance training;Therapeutic exercise   PT Goals (Current goals can be found in the Care Plan section) Acute Rehab PT Goals 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 PT Goal Formulation: With patient/family Time For Goal Achievement: 11/19/15 Potential to Achieve Goals: Good    Frequency Min 6X/week   Barriers to discharge        Co-evaluation               End of Session Equipment Utilized During Treatment: Back brace Activity Tolerance: Treatment limited secondary to medical complications (Comment) (dizziness with walking) Patient left: with call bell/phone within reach;in chair;with family/visitor present Nurse Communication: Mobility status         Time: 7829-5621 PT Time Calculation (min) (ACUTE ONLY): 34 min   Charges:   PT Evaluation $PT Eval Low Complexity: 1 Procedure PT Treatments $Gait Training: 8-22 mins   PT G Codes:        Tamala Ser 11/12/2015, 1:42 PM [phone]

## 2015-11-12 NOTE — Progress Notes (Signed)
     Subjective: 1 Day Post-Op Procedure(s) (LRB): TRANSFORAMINAL LUMBAR INTERBODY FUSIONS L4-5 AND L5-S1 WITH CAGES, PEDICLE SCREWS AND RODS (N/A)Awake, alert and oriented x4. Pain level is 6-7.  Patient reports pain as moderate.    Objective:   VITALS:  Temp:  [98.1 F (36.7 C)-99.6 F (37.6 C)] 99.5 F (37.5 C) (06/14 0524) Pulse Rate:  [80-100] 100 (06/14 0524) Resp:  [11-19] 18 (06/14 0524) BP: (104-132)/(69-86) 109/71 mmHg (06/14 0524) SpO2:  [93 %-100 %] 95 % (06/14 0524)  Neurologically intact ABD soft Neurovascular intact Sensation intact distally Intact pulses distally Dorsiflexion/Plantar flexion intact Incision: dressing C/D/I Foley is discontinued.  LABS No results for input(s): HGB, WBC, PLT in the last 72 hours. No results for input(s): NA, K, CL, CO2, BUN, CREATININE, GLUCOSE in the last 72 hours. No results for input(s): LABPT, INR in the last 72 hours.   Assessment/Plan: 1 Day Post-Op Procedure(s) (LRB): TRANSFORAMINAL LUMBAR INTERBODY FUSIONS L4-5 AND L5-S1 WITH CAGES, PEDICLE SCREWS AND RODS (N/A)  Up with therapy  Continue IVF as his abdomen exam is with distension and suggests he may have some decrease bowel function, ileus.\ Check lab when it returns. Brace when out of bed.  NITKA,JAMES E 11/12/2015, 7:24 AM

## 2015-11-12 NOTE — Progress Notes (Addendum)
PT/OT  Cancellation Note  Patient Details Name: Philip NorfolkLarry J Nasser MRN: [id number] DOB: [birth year]   Cancelled Treatment:    Reason Eval/Treat Not Completed: Other (comment) (no brace). Awaiting arrival of brace. PT/OT to return as able.   Marcene BrawnChadwell, Danyel Tobey Marie 11/12/2015, 7:39 AM  Lewis ShockAshly Zamyia Gowell, PT, DPT Pager #: [phone] Office #: [phone]

## 2015-11-13 MED ORDER — MAGNESIUM HYDROXIDE 400 MG/5ML PO SUSP
30.0000 mL | Freq: Every day | ORAL | Status: DC | PRN
  Administered 2015-11-14: 30 mL via ORAL
  Filled 2015-11-13: qty 30

## 2015-11-13 NOTE — Progress Notes (Signed)
     Subjective: 2 Days Post-Op Procedure(s) (LRB): TRANSFORAMINAL LUMBAR INTERBODY FUSIONS L4-5 AND L5-S1 WITH CAGES, PEDICLE SCREWS AND RODS (N/A) Awake, alert and oriented x 4. Sitting up in bed with breakfast, wife is assisting with meal. Discomfort with turning in bed. Voiding Without difficulty.  Patient reports pain as moderate.    Objective:   VITALS:  Temp:  [98 F (36.7 C)-99.8 F (37.7 C)] 98 F (36.7 C) (06/15 1008) Pulse Rate:  [73-106] 89 (06/15 1008) Resp:  [18-19] 19 (06/15 1008) BP: (123-145)/(75-91) 124/83 mmHg (06/15 1008) SpO2:  [92 %-97 %] 94 % (06/15 1008)  Neurologically intact ABD soft Neurovascular intact Sensation intact distally Intact pulses distally Dorsiflexion/Plantar flexion intact Incision: scant drainage and Mild bilateral swelling of the lumbar paralumbar para incision areas Dressing changed.  Abdomen still with distension, + flatus today   LABS  Recent Labs  11/12/15 0709  HGB 12.8*  WBC 6.2  PLT 132*    Recent Labs  11/12/15 0709  NA 137  K 3.8  CL 106  CO2 27  BUN 13  CREATININE 1.35*  GLUCOSE 106*   No results for input(s): LABPT, INR in the last 72 hours.   Assessment/Plan: 2 Days Post-Op Procedure(s) (LRB): TRANSFORAMINAL LUMBAR INTERBODY FUSIONS L4-5 AND L5-S1 WITH CAGES, PEDICLE SCREWS AND RODS (N/A)  Anemia, minimal Slow bowel function return.  Advance diet Up with therapy Discharge home with home health when he is stable Friday or Saturday. GI function has not returned to normal yet.  NITKA,JAMES E 11/13/2015, 12:55 PM

## 2015-11-13 NOTE — Care Management Note (Addendum)
Case Management Note  Patient Details  Name: Philip Peters MRN: [id number] Date of Birth: [birth year]  Subjective/Objective:                    Action/Plan: Plan at discharge is for patient to d/c home with [facility] services. CM met with the patient and his wife and provided them a list of [facility] agencies in the [place] area. They selected [facility]. Donnna with [facility] notified and accepted the referral. Butch Penny also informed of the 3 in 1 and walker and she will have the equipment delivered to the room. Addendum: Pts PCP is Dr Carlean Purl   Expected Discharge Date:                  Expected Discharge Plan:  [place]  In-House Referral:     Discharge planning Services  CM Consult  Post Acute Care Choice:  Durable Medical Equipment, Home Health Choice offered to:  Patient, Spouse  DME Arranged:  3-N-1, Walker rolling DME Agency:  [place]:  PT [place]:  [facility]  Status of Service:  Completed, signed off  Medicare Important Message Given:    Date Medicare IM Given:    Medicare IM give by:    Date Additional Medicare IM Given:    Additional Medicare Important Message give by:     If discussed at [place] of Stay Meetings, dates discussed:    Additional Comments:  Pollie Friar, RN 11/13/2015, 3:29 PM

## 2015-11-13 NOTE — Progress Notes (Signed)
Physical Therapy Treatment Patient Details Name: Philip Peters MRN: [id number] DOB: 09-Jun-1955 Today's Date: 11/13/2015    History of Present Illness 60 y.o. male with spondylolisthesis, spinal stenosis admitted for TLIF L4-5, L5-S1.     PT Comments    Patient progressing well towards PT goals. Improved ambulation distance today. Tolerated stair training with Min guard assist for safety. Pt able to recall 2/3 back precautions. Reviewed car transfer and how to get in/out of tub. Will plan for negotiating 4 normal height stairs tomorrow prior to discharge. Will follow acutely.   Follow Up Recommendations  Home health PT;Supervision for mobility/OOB     Equipment Recommendations  Rolling walker with 5" wheels;3in1 (PT)    Recommendations for Other Services       Precautions / Restrictions Precautions Precautions: Back Precaution Booklet Issued: No Precaution Comments: Reviewed back precautions Required Braces or Orthoses: Spinal Brace Spinal Brace: Thoracolumbosacral orthotic;Applied in sitting position Restrictions Weight Bearing Restrictions: No    Mobility  Bed Mobility Overal bed mobility: Needs Assistance Bed Mobility: Rolling;Sidelying to Sit Rolling: Min guard Sidelying to sit: Min guard       General bed mobility comments: HOB flat, no use of rails to simulate home. Good demo of log roll technique. Increased time.  Transfers Overall transfer level: Needs assistance Equipment used: Rolling walker (2 wheeled) Transfers: Sit to/from Stand Sit to Stand: Min guard         General transfer comment: MIn guard for safety. Despite cues for technique, pt pulling up on RW.   Ambulation/Gait Ambulation/Gait assistance: Supervision Ambulation Distance (Feet): 200 Feet Assistive device: Rolling walker (2 wheeled) Gait Pattern/deviations: Step-through pattern;Decreased stride length Gait velocity: decreased Gait velocity interpretation: Below normal speed for  age/gender General Gait Details: Slow, guarded gait. Good demo of precautions during mobility.    Stairs Stairs: Yes Stairs assistance: Min guard Stair Management: Step to pattern;Two rails Number of Stairs: 3 (+ 2 steps x2 bouts) General stair comments: Cues for technique and safety.  Wheelchair Mobility    Modified Rankin (Stroke Patients Only)       Balance Overall balance assessment: Needs assistance Sitting-balance support: Feet supported;No upper extremity supported Sitting balance-Leahy Scale: Good Sitting balance - Comments: Able to donn brace in sitting with setup.   Standing balance support: During functional activity Standing balance-Leahy Scale: Fair                      Cognition Arousal/Alertness: Awake/alert Behavior During Therapy: WFL for tasks assessed/performed Overall Cognitive Status: Within Functional Limits for tasks assessed       Memory: Decreased recall of precautions              Exercises      General Comments        Pertinent Vitals/Pain Pain Assessment: 0-10 Pain Score: 1  Pain Location: back at incision Pain Descriptors / Indicators: Operative site guarding Pain Intervention(s): Monitored during session;Repositioned    Home Living                      Prior Function            PT Goals (current goals can now be found in the care plan section) Progress towards PT goals: Progressing toward goals    Frequency  Min 6X/week    PT Plan Current plan remains appropriate    Co-evaluation             End of Session  Equipment Utilized During Treatment: Back brace Activity Tolerance: Patient tolerated treatment well Patient left: Other (comment);with family/visitor present;with nursing/sitter in room (on [facility] with tech present in room)     Time: 7846-9629 PT Time Calculation (min) (ACUTE ONLY): 26 min  Charges:  $Gait Training: 23-37 mins                    G Codes:      Philip Peters  Philip Peters 11/13/2015, 11:17 AM Philip Peters, PT, DPT [phone]

## 2015-11-14 ENCOUNTER — Encounter (HOSPITAL_COMMUNITY): Payer: Self-pay | Admitting: Specialist

## 2015-11-14 LAB — CBC WITH DIFFERENTIAL/PLATELET
Basophils Absolute: 0 10*3/uL (ref 0.0–0.1)
Basophils Relative: 0 %
Eosinophils Absolute: 0.1 10*3/uL (ref 0.0–0.7)
Eosinophils Relative: 1 %
HEMATOCRIT: 37.7 % — AB (ref 39.0–52.0)
HEMOGLOBIN: 12.4 g/dL — AB (ref 13.0–17.0)
LYMPHS ABS: 1.7 10*3/uL (ref 0.7–4.0)
LYMPHS PCT: 21 %
MCH: 29.5 pg (ref 26.0–34.0)
MCHC: 32.9 g/dL (ref 30.0–36.0)
MCV: 89.8 fL (ref 78.0–100.0)
MONO ABS: 0.6 10*3/uL (ref 0.1–1.0)
MONOS PCT: 7 %
NEUTROS ABS: 5.7 10*3/uL (ref 1.7–7.7)
NEUTROS PCT: 71 %
Platelets: 148 10*3/uL — ABNORMAL LOW (ref 150–400)
RBC: 4.2 MIL/uL — ABNORMAL LOW (ref 4.22–5.81)
RDW: 13 % (ref 11.5–15.5)
WBC: 8 10*3/uL (ref 4.0–10.5)

## 2015-11-14 LAB — BASIC METABOLIC PANEL
Anion gap: 6 (ref 5–15)
BUN: 10 mg/dL (ref 6–20)
CALCIUM: 8.7 mg/dL — AB (ref 8.9–10.3)
CHLORIDE: 103 mmol/L (ref 101–111)
CO2: 26 mmol/L (ref 22–32)
CREATININE: 1.13 mg/dL (ref 0.61–1.24)
GFR calc Af Amer: >60 mL/min (ref >60)
GFR calc non Af Amer: >60 mL/min (ref >60)
GLUCOSE: 132 mg/dL — AB (ref 65–99)
Potassium: 3.5 mmol/L (ref 3.5–5.1)
Sodium: 135 mmol/L (ref 135–145)

## 2015-11-14 MED ORDER — METHOCARBAMOL 500 MG PO TABS: 500.0000 mg | ORAL_TABLET | Freq: Four times a day (QID) | ORAL | Status: DC | PRN

## 2015-11-14 MED ORDER — OXYCODONE-ACETAMINOPHEN 5-325 MG PO TABS: 1.0000 | ORAL_TABLET | ORAL | Status: DC | PRN

## 2015-11-14 MED ORDER — DOCUSATE SODIUM 100 MG PO CAPS: 100.0000 mg | ORAL_CAPSULE | Freq: Two times a day (BID) | ORAL | Status: DC

## 2015-11-14 NOTE — Progress Notes (Signed)
     Subjective: 3 Days Post-Op Procedure(s) (LRB): TRANSFORAMINAL LUMBAR INTERBODY FUSIONS L4-5 AND L5-S1 WITH CAGES, PEDICLE SCREWS AND RODS (N/A) Awake,alert and oriented x 4. Standing and walking to the BR. BM today. Work with PT/OT today likey will be ready to discharge in AM 11/15/2015. Patient reports pain as moderate.    Objective:   VITALS:  Temp:  [98 F (36.7 C)-99.2 F (37.3 C)] 99.1 F (37.3 C) (06/16 0429) Pulse Rate:  [89-106] 92 (06/16 0429) Resp:  [16-20] 20 (06/16 0429) BP: (124-134)/(83-92) 129/92 mmHg (06/16 0429) SpO2:  [93 %-96 %] 94 % (06/16 0429) Weight:  [228 lb (103.42 kg)] 228 lb (103.42 kg) (06/16 0429)  Neurologically intact ABD soft Neurovascular intact Sensation intact distally Intact pulses distally Dorsiflexion/Plantar flexion intact Incision: no drainage   LABS  Recent Labs  11/12/15 0709  HGB 12.8*  WBC 6.2  PLT 132*    Recent Labs  11/12/15 0709  NA 137  K 3.8  CL 106  CO2 27  BUN 13  CREATININE 1.35*  GLUCOSE 106*   No results for input(s): LABPT, INR in the last 72 hours.   Assessment/Plan: 3 Days Post-Op Procedure(s) (LRB): TRANSFORAMINAL LUMBAR INTERBODY FUSIONS L4-5 AND L5-S1 WITH CAGES, PEDICLE SCREWS AND RODS (N/A)  Advance diet Up with therapy Plan for discharge tomorrow Discharge home with home health  Kendle Erker E 11/14/2015, 8:17 AM

## 2015-11-14 NOTE — Progress Notes (Signed)
Physical Therapy Treatment Patient Details Name: Philip Peters MRN: [id number] DOB: [birth year] Today's Date: 11/14/2015    History of Present Illness 60 y.o. male with spondylolisthesis, spinal stenosis admitted for TLIF L4-5, L5-S1.     PT Comments    Patient progressing well towards PT goals. Performing bed mobility, transfers and ambulation Mod I. Tolerated negotiating 1 flight of steps without difficulty. Able to recall 3/3 back precautions without cues. Reviewed car transfer and how to roll over in bed at night. Encouraged ambulation with wife 3 times per day. Pt is ambulating at Mod I level and does not require further acute PT services while in hospital. Will defer to [facility]. Pt has met all goals. Discharge from therapy.  Follow Up Recommendations  Home health PT;Supervision for mobility/OOB     Equipment Recommendations  Rolling walker with 5" wheels;3in1 (PT)    Recommendations for Other Services       Precautions / Restrictions Precautions Precautions: Back Precaution Booklet Issued: No Precaution Comments: Reviewed back precautions Required Braces or Orthoses: Spinal Brace Spinal Brace: Thoracolumbosacral orthotic;Applied in sitting position Restrictions Weight Bearing Restrictions: No    Mobility  Bed Mobility Overal bed mobility: Needs Assistance Bed Mobility: Rolling;Sidelying to Sit;Sit to Sidelying Rolling: Modified independent (Device/Increase time) Sidelying to sit: Modified independent (Device/Increase time)     Sit to sidelying: Modified independent (Device/Increase time) General bed mobility comments: HOB flat, no use of rails to simulate home. Good demo of log roll technique. Increased time. No assist needed.  Transfers Overall transfer level: Needs assistance Equipment used: Rolling walker (2 wheeled) Transfers: Sit to/from Stand Sit to Stand: Modified independent (Device/Increase time);Supervision         General transfer comment: Supervision  progressing to Mod I for standing. reviewed cues for technique. Increased time but no assist needed.  Ambulation/Gait Ambulation/Gait assistance: Modified independent (Device/Increase time) Ambulation Distance (Feet): 500 Feet Assistive device: Rolling walker (2 wheeled) Gait Pattern/deviations: Step-through pattern;Decreased stride length Gait velocity: decreased   General Gait Details: Slow, guarded gait. Good demo of precautions during mobility.    Stairs Stairs: Yes Stairs assistance: Supervision Stair Management: Step to pattern;One rail Left Number of Stairs: 13 General stair comments: Cues for technique and safety.  Wheelchair Mobility    Modified Rankin (Stroke Patients Only)       Balance Overall balance assessment: Needs assistance Sitting-balance support: Feet supported;No upper extremity supported Sitting balance-Leahy Scale: Good     Standing balance support: During functional activity Standing balance-Leahy Scale: Good                      Cognition Arousal/Alertness: Awake/alert Behavior During Therapy: WFL for tasks assessed/performed Overall Cognitive Status: Within Functional Limits for tasks assessed                      Exercises      General Comments        Pertinent Vitals/Pain Pain Assessment: 0-10 Pain Score: 1  Pain Location: back at incision Pain Descriptors / Indicators: Operative site guarding Pain Intervention(s): Monitored during session;Repositioned    Home Living                      Prior Function            PT Goals (current goals can now be found in the care plan section) Progress towards PT goals: Goals met/education completed, patient discharged from PT    Frequency  Min 6X/week    PT Plan Current plan remains appropriate    Co-evaluation             End of Session Equipment Utilized During Treatment: Gait belt;Back brace Activity Tolerance: Patient tolerated treatment  well Patient left: in chair;with call bell/phone within reach;with family/visitor present     Time: 6378-5885 PT Time Calculation (min) (ACUTE ONLY): 25 min  Charges:  $Gait Training: 8-22 mins $Therapeutic Activity: 8-22 mins                    G Codes:      Nixie Laube A Shelvie Salsberry 11/14/2015, 12:08 PM Wray Kearns, [place], DPT [phone]

## 2015-11-14 NOTE — Progress Notes (Signed)
     Subjective: 3 Days Post-Op Procedure(s) (LRB): TRANSFORAMINAL LUMBAR INTERBODY FUSIONS L4-5 AND L5-S1 WITH CAGES, PEDICLE SCREWS AND RODS (N/A) Awake,alert and oriented x 4. + BM Tolerating pos and po narcotics. Patient reports pain as moderate.    Objective:   VITALS:  Temp:  [98 F (36.7 C)-99.2 F (37.3 C)] 98.4 F (36.9 C) (06/16 1400) Pulse Rate:  [92-106] 94 (06/16 1400) Resp:  [17-20] 17 (06/16 1400) BP: (123-130)/(72-92) 123/72 mmHg (06/16 1400) SpO2:  [93 %-100 %] 100 % (06/16 1400) Weight:  [228 lb (103.42 kg)] 228 lb (103.42 kg) (06/16 0429)  Neurologically intact ABD soft Neurovascular intact Sensation intact distally Intact pulses distally Dorsiflexion/Plantar flexion intact Incision: scant drainage   LABS  Recent Labs  11/12/15 0709 11/14/15 1248  HGB 12.8* 12.4*  WBC 6.2 8.0  PLT 132* 148*    Recent Labs  11/12/15 0709 11/14/15 1248  NA 137 135  K 3.8 3.5  CL 106 103  CO2 27 26  BUN 13 10  CREATININE 1.35* 1.13  GLUCOSE 106* 132*   No results for input(s): LABPT, INR in the last 72 hours.   Assessment/Plan: 3 Days Post-Op Procedure(s) (LRB): TRANSFORAMINAL LUMBAR INTERBODY FUSIONS L4-5 AND L5-S1 WITH CAGES, PEDICLE SCREWS AND RODS (N/A)  Advance diet Up with therapy D/C IV fluids Plan for discharge tomorrow  May bathe in the AM with bandage in place then change dressing post shower, remove brace to shower replace brace after shower.  Trevaun Rendleman E 11/14/2015, 6:07 PM

## 2015-11-15 NOTE — Progress Notes (Signed)
Subjective: Pt stable - pain ok   Objective: Vital signs in last 24 hours: Temp:  [98.4 F (36.9 C)-99.7 F (37.6 C)] 98.9 F (37.2 C) (06/17 0939) Pulse Rate:  [87-98] 87 (06/17 0939) Resp:  [17-20] 20 (06/17 0939) BP: (104-133)/(65-79) 122/75 mmHg (06/17 0939) SpO2:  [95 %-100 %] 99 % (06/17 0939)  Intake/Output from previous day: 06/16 0701 - 06/17 0700 In: 1080 [P.O.:1080] Out: -  Intake/Output this shift: Total I/O In: 240 [P.O.:240] Out: -   Exam:  Dorsiflexion/Plantar flexion intact  Labs:  Recent Labs  11/14/15 1248  HGB 12.4*    Recent Labs  11/14/15 1248  WBC 8.0  RBC 4.20*  HCT 37.7*  PLT 148*    Recent Labs  11/14/15 1248  NA 135  K 3.5  CL 103  CO2 26  BUN 10  CREATININE 1.13  GLUCOSE 132*  CALCIUM 8.7*   No results for input(s): LABPT, INR in the last 72 hours.  Assessment/Plan: Dc today - i placed rx on chart    Philip Peters 11/15/2015, 12:13 PM

## 2015-11-18 NOTE — Discharge Summary (Signed)
Physician Discharge Summary      Patient ID: Philip Peters MRN: [id number] DOB/AGE: 1955-10-05 60 y.o.  Admit date: 11/11/2015 Discharge date: 11/15/2015  Admission Diagnoses:  Principal Problem:   Spinal stenosis, lumbar region, with neurogenic claudication Active Problems:   Degenerative disc disease, lumbar   Spondylolisthesis of lumbar region   Lumbar degenerative disc disease   Discharge Diagnoses:  Same  Past Medical History  Diagnosis Date  . Allergy   . Asthma   . Arthritis     Surgeries: Procedure(s): TRANSFORAMINAL LUMBAR INTERBODY FUSIONS L4-5 AND L5-S1 WITH CAGES, PEDICLE SCREWS AND RODS on 11/11/2015   Consultants:    Discharged Condition: Improved  Hospital Course: EYTHAN JAYNE is an 60 y.o. male who was admitted 11/11/2015 with a chief complaint of No chief complaint on file. , and found to have a diagnosis of Spinal stenosis, lumbar region, with neurogenic claudication.  He was brought to the operating room on 11/11/2015 and underwent the above named procedures.    He was given perioperative antibiotics:  Anti-infectives    Start     Dose/Rate Route Frequency Ordered Stop   11/11/15 1900  ceFAZolin (ANCEF) IVPB 1 g/50 mL premix     1 g 100 mL/hr over 30 Minutes Intravenous Every 8 hours 11/11/15 1701 11/12/15 0247   11/11/15 0700  ceFAZolin (ANCEF) IVPB 2g/100 mL premix     2 g 200 mL/hr over 30 Minutes Intravenous To [facility]Stay [facility] 11/10/15 1205 11/11/15 1145    On POD#1 foley was discontinued and he was started in PT and OT. Progressed slowly and therapy gradually was able to progress to walking in hallway. He was able to void without difficulty.A brace placed prior to his being out of bed. POD#2 dressings were changed. Lab returned without abnormalities, Hgb 12.8. He had moderate abdomenal distended. Bowel sounds were decrease.. IVF was continued and on POD#2 BS improved and he was gradually weaned from IVF. PT/OT continued. POD#3, dressings  changed with scant bloody drainage. Legs neurovascular normal.  Progressed in PT OT and Was then Started with stair training. He was stable and awake and alert and oriented x 4 on POD#4. Taking and tolerated po nourishment and po narcotic medications. He was discharged home on POD#4. Family unable to be available to help at home until POD#4. Laboratory on  6/16  Showed Hgb 12.4 and normal BMET.Marland Kitchen   He was given sequential compression devices, early ambulation, and chemoprophylaxis for DVT prophylaxis.  He benefited maximally from their hospital stay and there were no complications.    Recent vital signs:  Filed Vitals:   11/15/15 0548 11/15/15 0939  BP: 104/65 122/75  Pulse: 96 87  Temp: 98.5 F (36.9 C) 98.9 F (37.2 C)  Resp: 18 20    Recent laboratory studies:  Results for orders placed or performed during the hospital encounter of 11/11/15  CBC  Result Value Ref Range   WBC 6.2 4.0 - 10.5 K/uL   RBC 4.48 4.22 - 5.81 MIL/uL   Hemoglobin 12.8 (L) 13.0 - 17.0 g/dL   HCT 40.9 81.1 - 91.4 %   MCV 90.2 78.0 - 100.0 fL   MCH 28.6 26.0 - 34.0 pg   MCHC 31.7 30.0 - 36.0 g/dL   RDW 78.2 95.6 - 21.3 %   Platelets 132 (L) 150 - 400 K/uL  Basic Metabolic Panel  Result Value Ref Range   Sodium 137 135 - 145 mmol/L   Potassium 3.8 3.5 - 5.1 mmol/L  Chloride 106 101 - 111 mmol/L   CO2 27 22 - 32 mmol/L   Glucose, Bld 106 (H) 65 - 99 mg/dL   BUN 13 6 - 20 mg/dL   Creatinine, Ser 4.09 (H) 0.61 - 1.24 mg/dL   Calcium 8.0 (L) 8.9 - 10.3 mg/dL   GFR calc non Af Amer 55 (L) >60 mL/min   GFR calc Af Amer >60 >60 mL/min   Anion gap 4 (L) 5 - 15  CBC with Differential/Platelet  Result Value Ref Range   WBC 8.0 4.0 - 10.5 K/uL   RBC 4.20 (L) 4.22 - 5.81 MIL/uL   Hemoglobin 12.4 (L) 13.0 - 17.0 g/dL   HCT 81.1 (L) 91.4 - 78.2 %   MCV 89.8 78.0 - 100.0 fL   MCH 29.5 26.0 - 34.0 pg   MCHC 32.9 30.0 - 36.0 g/dL   RDW 95.6 21.3 - 08.6 %   Platelets 148 (L) 150 - 400 K/uL   Neutrophils  Relative % 71 %   Neutro Abs 5.7 1.7 - 7.7 K/uL   Lymphocytes Relative 21 %   Lymphs Abs 1.7 0.7 - 4.0 K/uL   Monocytes Relative 7 %   Monocytes Absolute 0.6 0.1 - 1.0 K/uL   Eosinophils Relative 1 %   Eosinophils Absolute 0.1 0.0 - 0.7 K/uL   Basophils Relative 0 %   Basophils Absolute 0.0 0.0 - 0.1 K/uL  Basic metabolic panel  Result Value Ref Range   Sodium 135 135 - 145 mmol/L   Potassium 3.5 3.5 - 5.1 mmol/L   Chloride 103 101 - 111 mmol/L   CO2 26 22 - 32 mmol/L   Glucose, Bld 132 (H) 65 - 99 mg/dL   BUN 10 6 - 20 mg/dL   Creatinine, Ser 5.78 0.61 - 1.24 mg/dL   Calcium 8.7 (L) 8.9 - 10.3 mg/dL   GFR calc non Af Amer >60 >60 mL/min   GFR calc Af Amer >60 >60 mL/min   Anion gap 6 5 - 15    Discharge Medications:     Medication List    STOP taking these medications        HYDROcodone-acetaminophen 5-325 MG tablet  Commonly known as:  NORCO/VICODIN      TAKE these medications        albuterol 108 (90 Base) MCG/ACT inhaler  Commonly known as:  PROVENTIL HFA;VENTOLIN HFA  Inhale 2 puffs into the lungs every 4 (four) hours as needed for wheezing (cough, shortness of breath or wheezing.).     albuterol (2.5 MG/3ML) 0.083% nebulizer solution  Commonly known as:  PROVENTIL  Take 3 mLs (2.5 mg total) by nebulization every 6 (six) hours as needed for wheezing or shortness of breath.     docusate sodium 100 MG capsule  Commonly known as:  COLACE  Take 1 capsule (100 mg total) by mouth 2 (two) times daily.     Fluticasone Furoate 200 MCG/ACT Aepb  Commonly known as:  ARNUITY ELLIPTA  Inhale 1 puff into the lungs once.     methocarbamol 500 MG tablet  Commonly known as:  ROBAXIN  Take 1 tablet (500 mg total) by mouth every 6 (six) hours as needed for muscle spasms.     montelukast 10 MG tablet  Commonly known as:  SINGULAIR  Take 1 tablet (10 mg total) by mouth at bedtime.     oxyCODONE-acetaminophen 5-325 MG tablet  Commonly known as:  PERCOCET/ROXICET  Take  1-2 tablets by mouth every 4 (  four) hours as needed for moderate pain.        Diagnostic Studies: Dg Lumbar Spine Complete  11/11/2015  CLINICAL DATA:  TRANSFORAMINAL LUMBAR INTERBODY FUSIONS L4-5 AND L5-S1 WITH CAGES, PEDICLE SCREWS AND RODS. Fluoroscopic time: 1 minutes and 6 seconds EXAM: DG C-ARM GT 120 MIN; LUMBAR SPINE - COMPLETE 4+ VIEW COMPARISON:  MRI, 07/11/2015 FINDINGS: Submitted images show bilateral pedicle screws at L4, L5 and S1, which appear well-positioned and well-seated. Radiolucent disc spacer material is well centered at the L4-L5 and L5-S1 disc levels. No evidence of an operative complication. IMPRESSION: Operative imaging following lumbar spine fusion from L4 through S1. Electronically Signed   By: Amie Portland M.D.   On: 11/11/2015 14:03   Dg C-arm Gt 120 Min  11/11/2015  CLINICAL DATA:  TRANSFORAMINAL LUMBAR INTERBODY FUSIONS L4-5 AND L5-S1 WITH CAGES, PEDICLE SCREWS AND RODS. Fluoroscopic time: 1 minutes and 6 seconds EXAM: DG C-ARM GT 120 MIN; LUMBAR SPINE - COMPLETE 4+ VIEW COMPARISON:  MRI, 07/11/2015 FINDINGS: Submitted images show bilateral pedicle screws at L4, L5 and S1, which appear well-positioned and well-seated. Radiolucent disc spacer material is well centered at the L4-L5 and L5-S1 disc levels. No evidence of an operative complication. IMPRESSION: Operative imaging following lumbar spine fusion from L4 through S1. Electronically Signed   By: Amie Portland M.D.   On: 11/11/2015 14:03    Disposition: 01-Home or Self Care      Discharge Instructions    Call MD / Call 911    Complete by:  As directed   If you experience chest pain or shortness of breath, CALL 911 and be transported to the hospital emergency room.  If you develope a fever above 101 F, pus (white drainage) or increased drainage or redness at the wound, or calf pain, call your surgeon's office.     Call MD / Call 911    Complete by:  As directed   If you experience chest pain or shortness of  breath, CALL 911 and be transported to the hospital emergency room.  If you develope a fever above 101 F, pus (white drainage) or increased drainage or redness at the wound, or calf pain, call your surgeon's office.     Constipation Prevention    Complete by:  As directed   Drink plenty of fluids.  Prune juice may be helpful.  You may use a stool softener, such as Colace (over the counter) 100 mg twice a day.  Use MiraLax (over the counter) for constipation as needed.     Constipation Prevention    Complete by:  As directed   Drink plenty of fluids.  Prune juice may be helpful.  You may use a stool softener, such as Colace (over the counter) 100 mg twice a day.  Use MiraLax (over the counter) for constipation as needed.     Diet - low sodium heart healthy    Complete by:  As directed      Diet - low sodium heart healthy    Complete by:  As directed      Discharge instructions    Complete by:  As directed   Call if there is increasing drainage, fever greater than 101.5, severe head aches, and worsening nausea or light sensitivity. If shortness of breath, bloody cough or chest tightness or pain go to an emergency room. No lifting greater than 10 lbs. Avoid bending, stooping and twisting. Use brace when sitting and out of bed even to go to  bathroom. Walk in house for first 2 weeks then may start to get out slowly increasing distances up to one quarter mile by 4-6 weeks post op. After 3 days may shower and change dressing following bathing with shower.When bathing remove the brace shower and replace brace before getting out of the shower. If drainage, keep dry dressing and do not bathe the incision, use an moisture impervious dressing. Please call and return for scheduled follow up appointment 2 weeks from the time of surgery.     Driving restrictions    Complete by:  As directed   No driving for 3 weeks     Increase activity slowly as tolerated    Complete by:  As directed      Increase  activity slowly as tolerated    Complete by:  As directed      Lifting restrictions    Complete by:  As directed   No lifting for 12 weeks           Follow-up Information    Follow up with NITKA,JAMES E, MD In 2 weeks.   Specialty:  Orthopedic Surgery   Why:  For wound re-check   Contact information:   [address] [place] [address] [phone]        Signed: Kerrin ChampagneITKA,JAMES E 11/18/2015, 12:40 PM

## 2015-11-21 ENCOUNTER — Other Ambulatory Visit: Payer: Self-pay | Admitting: Specialist

## 2015-11-21 DIAGNOSIS — M545 Low back pain: Principal | ICD-10-CM

## 2015-11-21 DIAGNOSIS — G8929 Other chronic pain: Secondary | ICD-10-CM

## 2015-11-26 ENCOUNTER — Ambulatory Visit
Admission: RE | Admit: 2015-11-26 | Discharge: 2015-11-26 | Disposition: A | Discharge status: Home / Self Care | Payer: BLUE CROSS/BLUE SHIELD | Source: Ambulatory Visit | Attending: Specialist | Admitting: Specialist

## 2015-11-26 VITALS — BP 126/85 | HR 67

## 2015-11-26 DIAGNOSIS — M5136 Other intervertebral disc degeneration, lumbar region: Secondary | ICD-10-CM

## 2015-11-26 DIAGNOSIS — M4316 Spondylolisthesis, lumbar region: Secondary | ICD-10-CM

## 2015-11-26 DIAGNOSIS — M48062 Spinal stenosis, lumbar region with neurogenic claudication: Secondary | ICD-10-CM

## 2015-11-26 DIAGNOSIS — M545 Low back pain: Principal | ICD-10-CM

## 2015-11-26 DIAGNOSIS — G8929 Other chronic pain: Secondary | ICD-10-CM

## 2015-11-26 MED ORDER — DIAZEPAM 5 MG PO TABS
10.0000 mg | ORAL_TABLET | Freq: Once | ORAL | Status: AC
  Administered 2015-11-26: 5 mg via ORAL

## 2015-11-26 MED ORDER — IOPAMIDOL (ISOVUE-M 200) INJECTION 41%: 15.0000 mL | Freq: Once | INTRAMUSCULAR | Status: DC

## 2015-11-26 NOTE — Discharge Instructions (Signed)

## 2016-01-20 DIAGNOSIS — Z0271 Encounter for disability determination: Secondary | ICD-10-CM

## 2016-03-15 ENCOUNTER — Encounter: Payer: Self-pay | Admitting: Family Medicine

## 2016-03-15 ENCOUNTER — Ambulatory Visit (INDEPENDENT_AMBULATORY_CARE_PROVIDER_SITE_OTHER): Payer: Medicaid Other | Admitting: Family Medicine

## 2016-03-15 VITALS — BP 126/85 | HR 72 | Temp 98.1°F | Wt 232.4 lb

## 2016-03-15 DIAGNOSIS — Z1211 Encounter for screening for malignant neoplasm of colon: Secondary | ICD-10-CM

## 2016-03-15 DIAGNOSIS — M48062 Spinal stenosis, lumbar region with neurogenic claudication: Secondary | ICD-10-CM

## 2016-03-15 DIAGNOSIS — Z114 Encounter for screening for human immunodeficiency virus [HIV]: Secondary | ICD-10-CM | POA: Diagnosis not present

## 2016-03-15 DIAGNOSIS — J4540 Moderate persistent asthma, uncomplicated: Secondary | ICD-10-CM

## 2016-03-15 DIAGNOSIS — Z23 Encounter for immunization: Secondary | ICD-10-CM

## 2016-03-15 DIAGNOSIS — Z79899 Other long term (current) drug therapy: Secondary | ICD-10-CM | POA: Diagnosis not present

## 2016-03-15 DIAGNOSIS — R739 Hyperglycemia, unspecified: Secondary | ICD-10-CM | POA: Diagnosis not present

## 2016-03-15 DIAGNOSIS — Z1159 Encounter for screening for other viral diseases: Secondary | ICD-10-CM | POA: Diagnosis not present

## 2016-03-15 DIAGNOSIS — Z0000 Encounter for general adult medical examination without abnormal findings: Secondary | ICD-10-CM | POA: Insufficient documentation

## 2016-03-15 LAB — BASIC METABOLIC PANEL WITH GFR
BUN: 15 mg/dL (ref 7–25)
CO2: 27 mmol/L (ref 20–31)
CREATININE: 1.23 mg/dL (ref 0.70–1.25)
Calcium: 9.3 mg/dL (ref 8.6–10.3)
Chloride: 103 mmol/L (ref 98–110)
GFR, EST AFRICAN AMERICAN: 72 mL/min (ref >=60)
GFR, Est Non African American: 63 mL/min (ref >=60)
Glucose, Bld: 92 mg/dL (ref 65–99)
POTASSIUM: 4 mmol/L (ref 3.5–5.3)
Sodium: 138 mmol/L (ref 135–146)

## 2016-03-15 LAB — LIPID PANEL
Cholesterol: 195 mg/dL (ref 125–200)
HDL: 30 mg/dL — AB (ref >=40)
LDL Cholesterol: 143 mg/dL — ABNORMAL HIGH (ref <130)
Total CHOL/HDL Ratio: 6.5 Ratio — ABNORMAL HIGH (ref <=5.0)
Triglycerides: 111 mg/dL (ref <150)
VLDL: 22 mg/dL (ref <30)

## 2016-03-15 LAB — POCT GLYCOSYLATED HEMOGLOBIN (HGB A1C): HEMOGLOBIN A1C: 5.6

## 2016-03-15 MED ORDER — VARICELLA VIRUS VACCINE LIVE 1350 PFU/0.5ML IJ SUSR: 0.5000 mL | Freq: Once | INTRAMUSCULAR | 0 refills | Status: AC

## 2016-03-15 NOTE — Assessment & Plan Note (Signed)
Resolved. Followed by Orthopedics (Dr. Malka SoNitken). Asymptomatic at present. On disability. - F/u with ortho - No indication for pain control at this time

## 2016-03-15 NOTE — Assessment & Plan Note (Addendum)
Due for colonoscopy, no prior screening. Denies h/o constipation or diarrhea, no bloody stools. Also unsure of last A1C screen given family history. Says Tdap was received 2 years ago. - Referral to GI for screening colonoscopy - Discussed need for daily aspirin 81 mg - Will f/u with A1C, BMET, lipid panel, HIV and HCV screen - Received flu and pnemovax - Given Rx for zostavax - F/u in 1 year

## 2016-03-15 NOTE — Progress Notes (Signed)
Subjective:   Patient ID: Philip Peters    DOB: 1955/09/17, 60 y.o. male   MRN: [id number]  CC: Establishing Care  HPI: Philip Peters is a 60 y.o. male who presents to clinic today to establish as a new patient. Problems discussed today are as follows:  1. Asthma: former smoker 70 pack years (quit 2015) with long term history of asthma w/o diagnosis of COPD. "Has not seen pulmonologist in many years." Denies h/o hospitalizations for his asthma. Well controlled on albuterol inhaler. Last used 5 months ago. Denies fevers, chills, dyspnea, cough, chest pain, wheeze, fatigue.  2. Spinal Fusion: h/o neurogenic claudication with DDD. Underwent spinal fusion on 11/11/15. Followed by Dr. Malka So with orthopedics. Denies back pain, claudication, paresthesia, decreased motor function, saddle anesthesia, or urinary/bowel incontinence.  3. Preventative Care: was formerly follow by [facility] for care but switch to [facility] due to PCP retiring. Father has diabetes, patient is unsure about last A1c. No h/o screening colonoscopy. Says he has never experienced chest pain or was told he has CAD. Has a 70 pack year history.   ROS: See HPI for pertinent ROS.  PMFSH: Pertinent past medical, surgical, family, and social history were reviewed and updated as appropriate. Smoking status reviewed.  Medications reviewed. Current Outpatient Prescriptions  Medication Sig Dispense Refill  . albuterol (PROVENTIL HFA;VENTOLIN HFA) 108 (90 BASE) MCG/ACT inhaler Inhale 2 puffs into the lungs every 4 (four) hours as needed for wheezing (cough, shortness of breath or wheezing.). 1 Inhaler 12  . albuterol (PROVENTIL) (2.5 MG/3ML) 0.083% nebulizer solution Take 3 mLs (2.5 mg total) by nebulization every 6 (six) hours as needed for wheezing or shortness of breath. 150 mL 11  . Fluticasone Furoate (ARNUITY ELLIPTA) 200 MCG/ACT AEPB Inhale 1 puff into the lungs once. (Patient taking differently: Inhale 1 puff into the lungs daily. ) 30 each 5   . methocarbamol (ROBAXIN) 500 MG tablet Take 500 mg by mouth every 6 (six) hours as needed for muscle spasms.    . montelukast (SINGULAIR) 10 MG tablet Take 1 tablet (10 mg total) by mouth at bedtime. 30 tablet 5  . varicella virus vaccine live (VARIVAX) 1350 PFU/0.5ML INJ injection Inject 0.5 mLs into the skin once. 0.5 mL 0   No current facility-administered medications for this visit.     Objective:   BP 126/85 (BP Location: Left Arm, Patient Position: Sitting, Cuff Size: Normal)   Pulse 72   Temp 98.1 F (36.7 C) (Oral)   Wt 232 lb 6.4 oz (105.4 kg)   SpO2 94%   BMI 31.52 kg/m  Vitals and nursing note reviewed.  General: well nourished, well developed, in no acute distress with non-toxic appearance HEENT: normocephalic, atraumatic, moist mucous membranes Neck: supple, non-tender without lymphadenopathy CV: regular rate and rhythm without murmurs, rubs, or gallops Lungs: clear to auscultation bilaterally with normal work of breathing Abdomen: soft, non-tender, non-distended, no masses or organomegaly palpable, normoactive bowel sounds Back: vertical mature scar at lumbar region without tenderness to palpation Skin: warm, dry, no rashes or lesions, cap refill < 2 seconds Extremities: warm and well perfused, normal tone, 5/5 motor strength all 4 extremities with strong hallux dorsiflexion  Assessment & Plan:   Spinal stenosis, lumbar region, with neurogenic claudication Resolved. Followed by Orthopedics (Dr. Malka So). Asymptomatic at present. On disability. - F/u with ortho - No indication for pain control at this time  Asthma, moderate persistent Chronic. Well controlled. Significant tobacco abuse, quit 12 years ago. Not seen  pulmonology in many years, no notes identified in history. Seems to be well controlled on albuterol, Ellipta and Singular.  - Albuterol rescue inhaler PRN - Singulair 10 mg tablet QD - Arnuity Ellipta 1 pff QD - Received flu vaccination - Received  pneumovax  - Given zostavax Rx - Continue avoiding tobacco use   Encounter for screening and preventative care Due for colonoscopy, no prior screening. Denies h/o constipation or diarrhea, no bloody stools. Also unsure of last A1C screen given family history. Says Tdap was received 2 years ago. - Referral to GI for screening colonoscopy - Will f/u with A1C, BMET, lipid panel, HIV and HCV screen - Received flu and pnemovax - Given Rx for zostavax  Orders Placed This Encounter  Procedures  . Flu Vaccine QUAD 36+ mos IM  . Pneumococcal polysaccharide vaccine 23-valent greater than or equal to 62yo subcutaneous/IM  . BASIC METABOLIC PANEL WITH GFR  . Lipid panel  . HIV antibody  . Hepatitis C antibody  . Ambulatory referral to Gastroenterology    Referral Priority:   Routine    Referral Type:   Consultation    Referral Reason:   Specialty Services Required    Number of Visits Requested:   1  . POCT glycosylated hemoglobin (Hb A1C)   Meds ordered this encounter  Medications  . methocarbamol (ROBAXIN) 500 MG tablet    Sig: Take 500 mg by mouth every 6 (six) hours as needed for muscle spasms.  . varicella virus vaccine live (VARIVAX) 1350 PFU/0.5ML INJ injection    Sig: Inject 0.5 mLs into the skin once.    Dispense:  0.5 mL    Refill:  0    Durward Parcelavid Estha Few, DO [facility], PGY-1 03/15/2016 6:20 PM

## 2016-03-15 NOTE — Assessment & Plan Note (Addendum)
Chronic. Well controlled. Significant tobacco abuse, quit 12 years ago. Not seen pulmonology in many years, no notes identified in history. Seems to be well controlled on albuterol, Ellipta and Singular.  - Albuterol rescue inhaler PRN - Singulair 10 mg tablet QD - Arnuity Ellipta 1 pff QD - Received flu vaccination - Received pneumovax  - Given zostavax Rx - Continue avoiding tobacco use

## 2016-03-15 NOTE — Patient Instructions (Signed)
It was a pleasure to meet you today. Please see below to review our plan for today's visit.  1. You are now up to date on your flu and pneumococcal vaccines. 2. I have sent in a referral for colonoscopy screening. They should contact you about scheduling an appointment. 3. Please get you zostavax, I have given you a prescription to take. 4. I will notify you of your results. 5. Please follow up in 1 year.  Please call the clinic at [phone] if your symptoms worsen or you have any concerns. It was my pleasure to see you. -- Durward Parcelavid McMullen, DO [facility], PGY-1

## 2016-03-16 ENCOUNTER — Encounter: Payer: Self-pay | Admitting: Gastroenterology

## 2016-03-16 ENCOUNTER — Encounter: Payer: Self-pay | Admitting: Family Medicine

## 2016-03-16 LAB — HIV ANTIBODY (ROUTINE TESTING W REFLEX): HIV: NONREACTIVE

## 2016-03-16 LAB — HEPATITIS C ANTIBODY: HCV AB: NEGATIVE

## 2016-03-17 ENCOUNTER — Telehealth: Payer: Self-pay | Admitting: Family Medicine

## 2016-03-17 NOTE — Telephone Encounter (Signed)
[facility]. LDL was elevated at 143. Discussed with patient need for moderate intensity statin by ASCVD score (9.9%). Patient was offered to make lifestyle changes over the next 6 months and return for re-evaluation of levels. He will reduce fat and fried foods and continue to walk daily. If levels remain high, will consider starting a statin. -- Durward Parcelavid McMullen, DO [facility], PGY-1

## 2016-04-08 ENCOUNTER — Ambulatory Visit (INDEPENDENT_AMBULATORY_CARE_PROVIDER_SITE_OTHER): Payer: Medicaid Other | Admitting: Specialist

## 2016-04-08 ENCOUNTER — Encounter (INDEPENDENT_AMBULATORY_CARE_PROVIDER_SITE_OTHER): Payer: Self-pay | Admitting: Specialist

## 2016-04-08 VITALS — BP 126/78 | HR 87 | Ht 72.0 in | Wt 230.0 lb

## 2016-04-08 DIAGNOSIS — Z981 Arthrodesis status: Secondary | ICD-10-CM | POA: Diagnosis not present

## 2016-04-08 NOTE — Patient Instructions (Addendum)
Avoid twisting, bending, heavy lifting.   °

## 2016-04-08 NOTE — Progress Notes (Signed)
Office Visit Note   Patient: Philip Peters           Date of Birth: 08-15-1955           MRN: [id number] Visit Date: 04/08/2016              Requested by: Wendee Beaversavid J McMullen, DO [address] [place], [place] [id number] PCP: Wendee Beaversavid J McMullen, DO   Assessment & Plan: Visit Diagnoses:  1. S/P lumbar fusion     Plan: Advised patient that I am also extremely pleased with his progress up to this point. He will avoid bending and twisting heavy lifting. Follow up in the office in 6 was for recheck and will notify us immediately if there are any issues or concerns.  Follow-Up Instructions: Return in about 6 months (around 10/06/2016).   Orders:  No orders of the defined types were placed in this encounter.  No orders of the defined types were placed in this encounter.     Procedures: No procedures performed   Clinical Data: No additional findings.   Subjective: Chief Complaint  Patient presents with  . Lower Back - Pain, Follow-up    Patient is returning for 6 week recheck on low back. Patient is now 5 months post op from L4-5,L5-S1 TLIF. Patient states that his back is doing good. A little pain here and there but overall not bad at all. No radicular pain. No numbness.   Patient states that he is very pleased with his surgical result. Reports that he is 100% better from preop. Review of Systems  Constitutional: Negative.   HENT: Negative.   Respiratory: Negative.   Cardiovascular: Negative.   Gastrointestinal: Negative.   Musculoskeletal: Positive for back pain.  Neurological: Negative.   Psychiatric/Behavioral: Negative.      Objective: Vital Signs: BP 126/78   Pulse 87   Ht 6' (1.829 m)   Wt 230 lb (104.3 kg)   BMI 31.19 kg/m   Physical Exam  Constitutional: He is oriented to person, place, and time. No distress.  HENT:  Head: Normocephalic.  Eyes: EOM are normal. Pupils are equal, round, and reactive to light.  Neck: Normal range of motion.  Abdominal: He  exhibits no distension.  Musculoskeletal: He exhibits no tenderness.  Neurological: He is alert and oriented to person, place, and time.    Ortho Exam Gait is normal. Negative straight leg raise. Negative logroll bilaterally. No focal motor deficits. Specialty Comments:  No specialty comments available.  Imaging: No results found.   PMFS History: Patient Active Problem List   Diagnosis Date Noted  . Encounter for screening and preventative care 03/15/2016  . Spinal stenosis, lumbar region, with neurogenic claudication 11/11/2015    Class: Chronic  . Degenerative disc disease, lumbar 11/11/2015    Class: Chronic  . Spondylolisthesis of lumbar region 11/11/2015    Class: Chronic  . Lumbar degenerative disc disease 11/11/2015  . Asthma, moderate persistent 08/01/2014   Past Medical History:  Diagnosis Date  . Allergy   . Arthritis   . Asthma     Family History  Problem Relation Age of Onset  . Diabetes Father   . Cancer Sister     Past Surgical History:  Procedure Laterality Date  . FOREIGN BODY REMOVAL Right 05/22/2014   Procedure: FOREIGN BODY REMOVAL ADULT RIGHT INDEX;  Surgeon: Cindee SaltGary Kuzma, MD;  Location: [facility];  Service: Orthopedics;  Laterality: Right;  . left shoulder surgery    .  LUMBAR FUSION N/A 11/11/2015   Procedure: TRANSFORAMINAL LUMBAR INTERBODY FUSIONS L4-5 AND L5-S1 WITH CAGES, PEDICLE SCREWS AND RODS;  Surgeon: Kerrin ChampagneJames E Nitka, MD;  Location: [facility];  Service: Orthopedics;  Laterality: N/A;   Social History   Occupational History  . Not on file.   Social History Main Topics  . Smoking status: Former Smoker    Packs/day: 2.00    Years: 35.00    Quit date: 01/27/2004  . Smokeless tobacco: Never Used  . Alcohol use Yes     Comment: 3 cans per week  . Drug use: No  . Sexual activity: Yes

## 2016-04-28 ENCOUNTER — Ambulatory Visit (AMBULATORY_SURGERY_CENTER): Payer: Self-pay | Admitting: *Deleted

## 2016-04-28 VITALS — Ht 72.0 in | Wt 230.0 lb

## 2016-04-28 DIAGNOSIS — Z1211 Encounter for screening for malignant neoplasm of colon: Secondary | ICD-10-CM

## 2016-04-28 MED ORDER — NA SULFATE-K SULFATE-MG SULF 17.5-3.13-1.6 GM/177ML PO SOLN: 1.0000 | Freq: Once | ORAL | 0 refills | Status: AC

## 2016-04-28 NOTE — Progress Notes (Signed)
No egg or soy allergy known to patient  No issues with past sedation with any surgeries  or procedures, no intubation problems  No diet pills per patient No home 02 use per patient  No blood thinners per patient  Pt denies issues with constipation  No A fib or A flutter  emmi video to e mail    

## 2016-05-10 ENCOUNTER — Telehealth: Payer: Self-pay | Admitting: Gastroenterology

## 2016-05-10 NOTE — Telephone Encounter (Signed)
No, please do not charge cancellation fee. Thank you.

## 2016-05-12 ENCOUNTER — Encounter: Payer: Medicaid Other | Admitting: Gastroenterology

## 2016-09-21 ENCOUNTER — Encounter: Payer: Medicaid Other | Admitting: Gastroenterology

## 2016-10-06 ENCOUNTER — Ambulatory Visit (INDEPENDENT_AMBULATORY_CARE_PROVIDER_SITE_OTHER): Payer: Medicaid Other | Admitting: Specialist

## 2017-02-08 ENCOUNTER — Telehealth (INDEPENDENT_AMBULATORY_CARE_PROVIDER_SITE_OTHER): Payer: Self-pay

## 2017-02-08 NOTE — Telephone Encounter (Signed)
Patient called stated that he had surgery with Dr Otelia SergeantNitka over a year ago.  He was unable to keep last follow up appointment due to his insurance. He no longer has any insurance but wanted to know how much it would cost him to have a visit with Dr Otelia SergeantNitka. I tried to explain to him that it depended on what level of service was charged for his visit. He is requesting call back to discuss [phone] or his wifes number is [phone]

## 2017-02-10 NOTE — Telephone Encounter (Signed)
I called and LMOM that without insurance they are required to collect a $100 Deposit at the first visit then after that he can be billed if there is a further needs of treatment.

## 2017-05-05 ENCOUNTER — Ambulatory Visit (HOSPITAL_COMMUNITY)
Admission: EM | Admit: 2017-05-05 | Discharge: 2017-05-05 | Disposition: A | Discharge status: Home / Self Care | Payer: Self-pay | Attending: Family Medicine | Admitting: Family Medicine

## 2017-05-05 ENCOUNTER — Encounter (HOSPITAL_COMMUNITY): Payer: Self-pay | Admitting: Emergency Medicine

## 2017-05-05 ENCOUNTER — Telehealth (HOSPITAL_COMMUNITY): Payer: Self-pay | Admitting: Emergency Medicine

## 2017-05-05 DIAGNOSIS — R062 Wheezing: Secondary | ICD-10-CM

## 2017-05-05 DIAGNOSIS — R0981 Nasal congestion: Secondary | ICD-10-CM

## 2017-05-05 DIAGNOSIS — J320 Chronic maxillary sinusitis: Secondary | ICD-10-CM

## 2017-05-05 DIAGNOSIS — J4531 Mild persistent asthma with (acute) exacerbation: Secondary | ICD-10-CM

## 2017-05-05 DIAGNOSIS — R05 Cough: Secondary | ICD-10-CM

## 2017-05-05 MED ORDER — FLUTICASONE PROPIONATE 50 MCG/ACT NA SUSP: 1.0000 | Freq: Every day | NASAL | 2 refills | Status: DC

## 2017-05-05 MED ORDER — ALBUTEROL SULFATE (2.5 MG/3ML) 0.083% IN NEBU
INHALATION_SOLUTION | RESPIRATORY_TRACT | Status: AC
  Filled 2017-05-05: qty 3

## 2017-05-05 MED ORDER — PREDNISONE 10 MG PO TABS: 20.0000 mg | ORAL_TABLET | Freq: Every day | ORAL | 0 refills | Status: DC

## 2017-05-05 MED ORDER — IPRATROPIUM-ALBUTEROL 0.5-2.5 (3) MG/3ML IN SOLN
RESPIRATORY_TRACT | Status: AC
  Filled 2017-05-05: qty 3

## 2017-05-05 MED ORDER — AMOXICILLIN-POT CLAVULANATE 875-125 MG PO TABS: 1.0000 | ORAL_TABLET | Freq: Two times a day (BID) | ORAL | 0 refills | Status: DC

## 2017-05-05 MED ORDER — ALBUTEROL SULFATE (2.5 MG/3ML) 0.083% IN NEBU
2.5000 mg | INHALATION_SOLUTION | Freq: Once | RESPIRATORY_TRACT | Status: AC
  Administered 2017-05-05: 2.5 mg via RESPIRATORY_TRACT

## 2017-05-05 MED ORDER — ALBUTEROL SULFATE HFA 108 (90 BASE) MCG/ACT IN AERS: 1.0000 | INHALATION_SPRAY | Freq: Four times a day (QID) | RESPIRATORY_TRACT | 0 refills | Status: DC | PRN

## 2017-05-05 NOTE — ED Triage Notes (Signed)
Pt here with sinus pressure intermittent x 1 month

## 2017-05-05 NOTE — ED Provider Notes (Signed)
[facility]    CSN: [id number] Arrival date & time: 05/05/17  1043     History   Chief Complaint Chief Complaint  Patient presents with  . URI    HPI Philip Peters is a 61 y.o. male with H/O Asthma presented to clinic with CC of nasal congestion, facial pain and pressure, purulent nasal drainage, dry cough on and off  x 1 month. Cough with wheezing x 4-5 days. Ran out of Albuterol Inhaler. Taking OTC decongestant with no relief in Sx.  The history is provided by the patient.  URI  Presenting symptoms: congestion, cough and facial pain   Presenting symptoms: no fever, no rhinorrhea and no sore throat   Severity:  Moderate Onset quality:  Gradual Duration:  4 weeks Timing:  Constant Progression:  Worsening Chronicity:  Recurrent Relieved by:  Nothing Associated symptoms: sinus pain (yellow to green nasal drainage ) and wheezing     Past Medical History:  Diagnosis Date  . Allergy   . Arthritis   . Asthma     Patient Active Problem List   Diagnosis Date Noted  . Encounter for screening and preventative care 03/15/2016  . Spinal stenosis, lumbar region, with neurogenic claudication 11/11/2015    Class: Chronic  . Degenerative disc disease, lumbar 11/11/2015    Class: Chronic  . Spondylolisthesis of lumbar region 11/11/2015    Class: Chronic  . Lumbar degenerative disc disease 11/11/2015  . Asthma, moderate persistent 08/01/2014    Past Surgical History:  Procedure Laterality Date  . FOREIGN BODY REMOVAL Right 05/22/2014   Procedure: FOREIGN BODY REMOVAL ADULT RIGHT INDEX;  Surgeon: Cindee SaltGary Kuzma, MD;  Location: [facility];  Service: Orthopedics;  Laterality: Right;  . left shoulder surgery    . LUMBAR FUSION N/A 11/11/2015   Procedure: TRANSFORAMINAL LUMBAR INTERBODY FUSIONS L4-5 AND L5-S1 WITH CAGES, PEDICLE SCREWS AND RODS;  Surgeon: Kerrin ChampagneJames E Nitka, MD;  Location: [facility];  Service: Orthopedics;  Laterality: N/A;       Home Medications     Prior to Admission medications   Medication Sig Start Date End Date Taking? Authorizing Provider  albuterol (PROVENTIL HFA;VENTOLIN HFA) 108 (90 BASE) MCG/ACT inhaler Inhale 2 puffs into the lungs every 4 (four) hours as needed for wheezing (cough, shortness of breath or wheezing.). 02/21/15   Elvina SidleLauenstein, Kurt, MD  albuterol (PROVENTIL HFA;VENTOLIN HFA) 108 (90 Base) MCG/ACT inhaler Inhale 1-2 puffs into the lungs every 6 (six) hours as needed for wheezing or shortness of breath. 05/05/17   Louann Hopson, NP  amoxicillin-clavulanate (AUGMENTIN) 875-125 MG tablet Take 1 tablet by mouth every 12 (twelve) hours. 05/05/17   Labarron Durnin, NP  aspirin 81 MG chewable tablet Chew 81 mg by mouth daily.    [provider]  fluticasone (FLONASE) 50 MCG/ACT nasal spray Place 1 spray into both nostrils daily. 05/05/17   Zaydn Gutridge, NP  Fluticasone Furoate (ARNUITY ELLIPTA) 200 MCG/ACT AEPB Inhale 1 puff into the lungs once. Patient taking differently: Inhale 1 puff into the lungs daily.  09/29/15   Peyton NajjarHopper, David H, MD  methocarbamol (ROBAXIN) 500 MG tablet Take 500 mg by mouth every 6 (six) hours as needed for muscle spasms.    [provider]  montelukast (SINGULAIR) 10 MG tablet Take 1 tablet (10 mg total) by mouth at bedtime. 09/29/15   Peyton NajjarHopper, David H, MD  predniSONE (DELTASONE) 10 MG tablet Take 2 tablets (20 mg total) by mouth daily. 05/05/17   Genea Rheaume,  NP    Family History Family History  Problem Relation Age of Onset  . Diabetes Father   . Cancer Sister   . Colon cancer Neg Hx   . Colon polyps Neg Hx     Social History Social History   Tobacco Use  . Smoking status: Former Smoker    Packs/day: 2.00    Years: 35.00    Pack years: 70.00    Last attempt to quit: 01/27/2004    Years since quitting: 13.2  . Smokeless tobacco: Never Used  Substance Use Topics  . Alcohol use: Yes    Comment: 3 cans per week  . Drug use: No     Allergies     Patient has no known allergies.   Review of Systems Review of Systems  Constitutional: Negative.  Negative for fever.  HENT: Positive for congestion and sinus pain (yellow to green nasal drainage ). Negative for rhinorrhea and sore throat.   Respiratory: Positive for cough and wheezing. Negative for choking, chest tightness and shortness of breath.   Cardiovascular: Negative for chest pain.  Neurological: Negative.      Physical Exam Triage Vital Signs ED Triage Vitals  Enc Vitals Group     BP 05/05/17 1107 (!) 135/94     Pulse Rate 05/05/17 1107 71     Resp 05/05/17 1107 18     Temp 05/05/17 1107 (!) 97.4 F (36.3 C)     Temp Source 05/05/17 1107 Oral     SpO2 05/05/17 1107 96 %     Weight --      Height --      Head Circumference --      Peak Flow --      Pain Score 05/05/17 1108 3     Pain Loc --      Pain Edu? --      Excl. in GC? --    No data found.  Updated Vital Signs BP (!) 135/94 (BP Location: Right Arm)   Pulse 71   Temp (!) 97.4 F (36.3 C) (Oral)   Resp 18   SpO2 96%   Visual Acuity Right Eye Distance:   Left Eye Distance:   Bilateral Distance:    Right Eye Near:   Left Eye Near:    Bilateral Near:     Physical Exam  Constitutional: He is oriented to person, place, and time. He appears well-developed and well-nourished. No distress.  HENT:  Head: Normocephalic.  Nose: Mucosal edema (B/L nasal mucosal and Turbinates Inflammation ) present.  Eyes: Pupils are equal, round, and reactive to light.  Neck: Normal range of motion. Neck supple.  Cardiovascular: Normal rate and regular rhythm.  Pulmonary/Chest: No stridor. No respiratory distress. He has wheezes (diffuse wheezing to auscultation . Ran out of Albuterol Inhaler ). He has no rales.  Neurological: He is alert and oriented to person, place, and time.  Skin: Skin is warm.     UC Treatments / Results  Labs (all labs ordered are listed, but only abnormal results are displayed) Labs  Reviewed - No data to display  EKG  EKG Interpretation None       Radiology No results found.  Procedures Procedures (including critical care time)  Medications Ordered in UC Medications  albuterol (PROVENTIL) (2.5 MG/3ML) 0.083% nebulizer solution 2.5 mg (2.5 mg Nebulization Given 05/05/17 1128)     Initial Impression / Assessment and Plan / UC Course  I have reviewed the triage vital signs and the  nursing notes.  Pertinent labs & imaging results that were available during my care of the patient were reviewed by me and considered in my medical decision making (see chart for details).       Final Clinical Impressions(s) / UC Diagnoses   Final diagnoses:  Chronic maxillary sinusitis  Nasal congestion  Mild persistent asthma with acute exacerbation    ED Discharge Orders        Ordered    amoxicillin-clavulanate (AUGMENTIN) 875-125 MG tablet  Every 12 hours     05/05/17 1128    albuterol (PROVENTIL HFA;VENTOLIN HFA) 108 (90 Base) MCG/ACT inhaler  Every 6 hours PRN     05/05/17 1128    predniSONE (DELTASONE) 10 MG tablet  Daily     05/05/17 1128    fluticasone (FLONASE) 50 MCG/ACT nasal spray  Daily     05/05/17 1128       Controlled Substance Prescriptions [place] Controlled Substance Registry consulted? Not Applicable   Reinaldo Raddle, NP 05/05/17 1129

## 2017-05-05 NOTE — Telephone Encounter (Signed)
Pt called and stated that rx for antibiotics and albuterol not received by the pharmacy; [facility]

## 2017-05-05 NOTE — Discharge Instructions (Signed)
Continue Flonase and Albuterol as advised. Nasal saline rinse as recommended

## 2017-06-10 ENCOUNTER — Ambulatory Visit (HOSPITAL_COMMUNITY)
Admission: EM | Admit: 2017-06-10 | Discharge: 2017-06-10 | Disposition: A | Discharge status: Home / Self Care | Payer: Self-pay | Attending: Emergency Medicine | Admitting: Emergency Medicine

## 2017-06-10 ENCOUNTER — Ambulatory Visit (INDEPENDENT_AMBULATORY_CARE_PROVIDER_SITE_OTHER): Payer: Self-pay

## 2017-06-10 ENCOUNTER — Other Ambulatory Visit: Payer: Self-pay

## 2017-06-10 ENCOUNTER — Encounter (HOSPITAL_COMMUNITY): Payer: Self-pay | Admitting: Emergency Medicine

## 2017-06-10 DIAGNOSIS — J3489 Other specified disorders of nose and nasal sinuses: Secondary | ICD-10-CM

## 2017-06-10 DIAGNOSIS — J321 Chronic frontal sinusitis: Secondary | ICD-10-CM

## 2017-06-10 DIAGNOSIS — R0981 Nasal congestion: Secondary | ICD-10-CM

## 2017-06-10 MED ORDER — NAPROXEN 500 MG PO TABS: 500.0000 mg | ORAL_TABLET | Freq: Two times a day (BID) | ORAL | 0 refills | Status: DC

## 2017-06-10 MED ORDER — DOXYCYCLINE HYCLATE 50 MG PO CAPS: 50.0000 mg | ORAL_CAPSULE | Freq: Two times a day (BID) | ORAL | 0 refills | Status: DC

## 2017-06-10 NOTE — Discharge Instructions (Signed)
Use saline nasal spray to help with pressure  Take motrin and tylenol for pain as needed  Take abx with food

## 2017-06-10 NOTE — ED Triage Notes (Signed)
Pt was here a month ago with a sinus infection.  Pt states he took all medications prescribed to him as directed.  He is here today because he is still not any better.

## 2017-06-10 NOTE — ED Provider Notes (Signed)
[facility]    CSN: [id number] Arrival date & time: 06/10/17  1507     History   Chief Complaint Chief Complaint  Patient presents with  . Facial Pain    HPI Philip Peters is a 62 y.o. male.   Pt here for sinus pressure x 1 month. States that he has taken eveything otc with no relief. His face is hurting and has so much pressure and congestion. Denies any fevers.       Past Medical History:  Diagnosis Date  . Allergy   . Arthritis   . Asthma     Patient Active Problem List   Diagnosis Date Noted  . Encounter for screening and preventative care 03/15/2016  . Spinal stenosis, lumbar region, with neurogenic claudication 11/11/2015    Class: Chronic  . Degenerative disc disease, lumbar 11/11/2015    Class: Chronic  . Spondylolisthesis of lumbar region 11/11/2015    Class: Chronic  . Lumbar degenerative disc disease 11/11/2015  . Asthma, moderate persistent 08/01/2014    Past Surgical History:  Procedure Laterality Date  . FOREIGN BODY REMOVAL Right 05/22/2014   Procedure: FOREIGN BODY REMOVAL ADULT RIGHT INDEX;  Surgeon: Cindee Salt, MD;  Location: [facility];  Service: Orthopedics;  Laterality: Right;  . left shoulder surgery    . LUMBAR FUSION N/A 11/11/2015   Procedure: TRANSFORAMINAL LUMBAR INTERBODY FUSIONS L4-5 AND L5-S1 WITH CAGES, PEDICLE SCREWS AND RODS;  Surgeon: Kerrin Champagne, MD;  Location: [facility];  Service: Orthopedics;  Laterality: N/A;       Home Medications    Prior to Admission medications   Medication Sig Start Date End Date Taking? Authorizing Provider  albuterol (PROVENTIL HFA;VENTOLIN HFA) 108 (90 Base) MCG/ACT inhaler Inhale 1-2 puffs into the lungs every 6 (six) hours as needed for wheezing or shortness of breath. 05/05/17  Yes Multani, Bhupinder, NP  fluticasone (FLONASE) 50 MCG/ACT nasal spray Place 1 spray into both nostrils daily. 05/05/17  Yes Multani, Bhupinder, NP  Fluticasone Furoate (ARNUITY ELLIPTA) 200  MCG/ACT AEPB Inhale 1 puff into the lungs once. Patient taking differently: Inhale 1 puff into the lungs daily.  09/29/15  Yes Peyton Najjar, MD  montelukast (SINGULAIR) 10 MG tablet Take 1 tablet (10 mg total) by mouth at bedtime. 09/29/15  Yes Peyton Najjar, MD  albuterol (PROVENTIL HFA;VENTOLIN HFA) 108 (90 BASE) MCG/ACT inhaler Inhale 2 puffs into the lungs every 4 (four) hours as needed for wheezing (cough, shortness of breath or wheezing.). 02/21/15   Elvina Sidle, MD  amoxicillin-clavulanate (AUGMENTIN) 875-125 MG tablet Take 1 tablet by mouth every 12 (twelve) hours. 05/05/17   Multani, Bhupinder, NP  aspirin 81 MG chewable tablet Chew 81 mg by mouth daily.    [provider]  doxycycline (VIBRAMYCIN) 50 MG capsule Take 1 capsule (50 mg total) by mouth 2 (two) times daily. 06/10/17   Coralyn Mark, NP  methocarbamol (ROBAXIN) 500 MG tablet Take 500 mg by mouth every 6 (six) hours as needed for muscle spasms.    [provider]  naproxen (NAPROSYN) 500 MG tablet Take 1 tablet (500 mg total) by mouth 2 (two) times daily. 06/10/17   Coralyn Mark, NP  predniSONE (DELTASONE) 10 MG tablet Take 2 tablets (20 mg total) by mouth daily. 05/05/17   Multani, Bhupinder, NP    Family History Family History  Problem Relation Age of Onset  . Diabetes Father   . Cancer Sister   . Colon  cancer Neg Hx   . Colon polyps Neg Hx     Social History Social History   Tobacco Use  . Smoking status: Former Smoker    Packs/day: 2.00    Years: 35.00    Pack years: 70.00    Last attempt to quit: 01/27/2004    Years since quitting: 13.3  . Smokeless tobacco: Never Used  Substance Use Topics  . Alcohol use: Yes    Comment: 3 cans per week  . Drug use: No     Allergies   Patient has no known allergies.   Review of Systems Review of Systems  HENT: Positive for congestion, sinus pressure, sinus pain and sneezing.   Respiratory: Negative.   Cardiovascular: Negative.     Neurological: Negative.      Physical Exam Triage Vital Signs ED Triage Vitals  Enc Vitals Group     BP 06/10/17 1537 128/81     Pulse Rate 06/10/17 1537 78     Resp --      Temp 06/10/17 1537 97.8 F (36.6 C)     Temp Source 06/10/17 1537 Oral     SpO2 06/10/17 1537 99 %     Weight --      Height --      Head Circumference --      Peak Flow --      Pain Score 06/10/17 1535 0     Pain Loc --      Pain Edu? --      Excl. in GC? --    No data found.  Updated Vital Signs BP 128/81 (BP Location: Left Arm)   Pulse 78   Temp 97.8 F (36.6 C) (Oral)   SpO2 99%        Physical Exam  Constitutional: He appears well-developed.  HENT:  Right Ear: External ear normal.  Left Ear: External ear normal.  Very enlarged nasal passage, thick green mucus,   Eyes: Pupils are equal, round, and reactive to light.  Neck: Normal range of motion.  Cardiovascular: Normal rate and regular rhythm.  Pulmonary/Chest: Effort normal and breath sounds normal.  Neurological: He is alert.     UC Treatments / Results  Labs (all labs ordered are listed, but only abnormal results are displayed) Labs Reviewed - No data to display  EKG  EKG Interpretation None       Radiology Dg Sinuses Complete  Result Date: 06/10/2017 CLINICAL DATA:  Progressive sinus congestion and pain for the past 2 months. EXAM: PARANASAL SINUSES - COMPLETE 3 + VIEW COMPARISON:  None. FINDINGS: There is no waters view to adequately assess the maxillary sinuses. There is no visible paranasal sinus mucosal thickening or air-fluid levels. Multiple missing upper and lower teeth with multiple cavities in the remaining teeth. IMPRESSION: 1. Limited examination with no gross evidence of sinusitis. 2. Multiple missing upper and lower teeth with multiple cavities in the remaining teeth. Electronically Signed   By: Beckie Salts M.D.   On: 06/10/2017 16:28    Procedures Procedures (including critical care time)  Medications  Ordered in UC Medications - No data to display   Initial Impression / Assessment and Plan / UC Course  I have reviewed the triage vital signs and the nursing notes.  Pertinent labs & imaging results that were available during my care of the patient were reviewed by me and considered in my medical decision making (see chart for details).     Use saline nasal spray to help  with pressure  Take motrin and tylenol for pain as needed  Take abx with food    Final Clinical Impressions(s) / UC Diagnoses   Final diagnoses:  Chronic frontal sinusitis    ED Discharge Orders        Ordered    doxycycline (VIBRAMYCIN) 50 MG capsule  2 times daily     06/10/17 1624    naproxen (NAPROSYN) 500 MG tablet  2 times daily     06/10/17 1624       Controlled Substance Prescriptions [place] Controlled Substance Registry consulted? Not Applicable   Coralyn MarkMitchell, Errick Salts L, NP 06/10/17 1652

## 2017-07-14 ENCOUNTER — Ambulatory Visit (HOSPITAL_COMMUNITY)
Admission: EM | Admit: 2017-07-14 | Discharge: 2017-07-14 | Disposition: A | Discharge status: Home / Self Care | Payer: Self-pay | Attending: Internal Medicine | Admitting: Internal Medicine

## 2017-07-14 ENCOUNTER — Encounter (HOSPITAL_COMMUNITY): Payer: Self-pay | Admitting: Emergency Medicine

## 2017-07-14 DIAGNOSIS — J329 Chronic sinusitis, unspecified: Secondary | ICD-10-CM

## 2017-07-14 MED ORDER — AMOXICILLIN-POT CLAVULANATE 875-125 MG PO TABS: 1.0000 | ORAL_TABLET | Freq: Two times a day (BID) | ORAL | 0 refills | Status: AC

## 2017-07-14 MED ORDER — PREDNISONE 10 MG (21) PO TBPK: ORAL_TABLET | Freq: Every day | ORAL | 0 refills | Status: DC

## 2017-07-14 NOTE — ED Provider Notes (Signed)
[facility]    CSN: [id number] Arrival date & time: 07/14/17  1129     History   Chief Complaint Chief Complaint  Patient presents with  . URI    HPI Philip Peters is a 62 y.o. male history of asthma presenting today with congestion and concern for sinusitis.  He has had the symptoms for 4 months now.  He has previously been seen here and treated with short courses of Augmentin and doxycycline.  He states he did not have any improvement with these regimens.  He consistently feels stopped up, unable to breathe out of his nose.  He is also in here today because he has developed some wheezing.  Denies fever, chest pain.  Denies nausea, vomiting, diarrhea, abdominal pain.  States nasal polyps run in his family.  He is also tried many over-the-counter medicines including Flonase.  HPI  Past Medical History:  Diagnosis Date  . Allergy   . Arthritis   . Asthma     Patient Active Problem List   Diagnosis Date Noted  . Encounter for screening and preventative care 03/15/2016  . Spinal stenosis, lumbar region, with neurogenic claudication 11/11/2015    Class: Chronic  . Degenerative disc disease, lumbar 11/11/2015    Class: Chronic  . Spondylolisthesis of lumbar region 11/11/2015    Class: Chronic  . Lumbar degenerative disc disease 11/11/2015  . Asthma, moderate persistent 08/01/2014    Past Surgical History:  Procedure Laterality Date  . FOREIGN BODY REMOVAL Right 05/22/2014   Procedure: FOREIGN BODY REMOVAL ADULT RIGHT INDEX;  Surgeon: Cindee SaltGary Kuzma, MD;  Location: [facility];  Service: Orthopedics;  Laterality: Right;  . left shoulder surgery    . LUMBAR FUSION N/A 11/11/2015   Procedure: TRANSFORAMINAL LUMBAR INTERBODY FUSIONS L4-5 AND L5-S1 WITH CAGES, PEDICLE SCREWS AND RODS;  Surgeon: Kerrin ChampagneJames E Nitka, MD;  Location: [facility];  Service: Orthopedics;  Laterality: N/A;       Home Medications    Prior to Admission medications   Medication Sig Start  Date End Date Taking? Authorizing Provider  albuterol (PROVENTIL HFA;VENTOLIN HFA) 108 (90 BASE) MCG/ACT inhaler Inhale 2 puffs into the lungs every 4 (four) hours as needed for wheezing (cough, shortness of breath or wheezing.). 02/21/15   Elvina SidleLauenstein, Kurt, MD  albuterol (PROVENTIL HFA;VENTOLIN HFA) 108 (90 Base) MCG/ACT inhaler Inhale 1-2 puffs into the lungs every 6 (six) hours as needed for wheezing or shortness of breath. 05/05/17   Multani, Bhupinder, NP  amoxicillin-clavulanate (AUGMENTIN) 875-125 MG tablet Take 1 tablet by mouth 2 (two) times daily. 07/14/17 08/13/17  Analicia Skibinski C, PA-C  aspirin 81 MG chewable tablet Chew 81 mg by mouth daily.    [provider]  Fluticasone Furoate (ARNUITY ELLIPTA) 200 MCG/ACT AEPB Inhale 1 puff into the lungs once. Patient taking differently: Inhale 1 puff into the lungs daily.  09/29/15   Peyton NajjarHopper, David H, MD  methocarbamol (ROBAXIN) 500 MG tablet Take 500 mg by mouth every 6 (six) hours as needed for muscle spasms.    [provider]  montelukast (SINGULAIR) 10 MG tablet Take 1 tablet (10 mg total) by mouth at bedtime. 09/29/15   Peyton NajjarHopper, David H, MD  naproxen (NAPROSYN) 500 MG tablet Take 1 tablet (500 mg total) by mouth 2 (two) times daily. 06/10/17   Coralyn MarkMitchell, Melanie L, NP  predniSONE (STERAPRED UNI-PAK 21 TAB) 10 MG (21) TBPK tablet Take by mouth daily. Take 6 tabs by mouth daily  for 2  days, then 5 tabs for 2 days, then 4 tabs for 2 days, then 3 tabs for 2 days, 2 tabs for 2 days, then 1 tab by mouth daily for 2 days 07/14/17   Lew Dawes, PA-C    Family History Family History  Problem Relation Age of Onset  . Diabetes Father   . Cancer Sister   . Colon cancer Neg Hx   . Colon polyps Neg Hx     Social History Social History   Tobacco Use  . Smoking status: Former Smoker    Packs/day: 2.00    Years: 35.00    Pack years: 70.00    Last attempt to quit: 01/27/2004    Years since quitting: 13.4  . Smokeless tobacco:  Never Used  Substance Use Topics  . Alcohol use: Yes    Comment: 3 cans per week  . Drug use: No     Allergies   Patient has no known allergies.   Review of Systems Review of Systems  Constitutional: Negative for activity change, appetite change, fatigue and fever.  HENT: Positive for congestion, rhinorrhea and sinus pressure. Negative for ear pain, postnasal drip and sore throat.   Eyes: Negative for pain and itching.  Respiratory: Positive for wheezing. Negative for cough and shortness of breath.   Cardiovascular: Negative for chest pain.  Gastrointestinal: Negative for abdominal pain, diarrhea, nausea and vomiting.  Musculoskeletal: Negative for myalgias.  Skin: Negative for rash.  Neurological: Negative for dizziness, light-headedness and headaches.     Physical Exam Triage Vital Signs ED Triage Vitals [07/14/17 1204]  Enc Vitals Group     BP 125/63     Pulse Rate 75     Resp (!) 22     Temp (!) 97.5 F (36.4 C)     Temp Source Oral     SpO2 98 %     Weight      Height      Head Circumference      Peak Flow      Pain Score      Pain Loc      Pain Edu?      Excl. in GC?    No data found.  Updated Vital Signs BP 125/63 (BP Location: Left Arm)   Pulse 75   Temp (!) 97.5 F (36.4 C) (Oral)   Resp (!) 22   SpO2 98%   Visual Acuity Right Eye Distance:   Left Eye Distance:   Bilateral Distance:    Right Eye Near:   Left Eye Near:    Bilateral Near:     Physical Exam  Constitutional: He appears well-developed and well-nourished.  HENT:  Head: Normocephalic and atraumatic.  Bilateral TMs without erythema Nasal mucosa erythematous, turbinates swollen, no polyps visualized Posterior oropharynx erythematous, no tonsillar enlargement no exudate.  Eyes: Conjunctivae are normal.  Neck: Neck supple.  Cardiovascular: Normal rate and regular rhythm.  No murmur heard. Pulmonary/Chest: Effort normal. No respiratory distress. He has wheezes.  Appears to be  mainly breathing from mouth, diffuse wheezing bilaterally  Abdominal: Soft. There is no tenderness.  Musculoskeletal: He exhibits no edema.  Neurological: He is alert.  Skin: Skin is warm and dry.  Psychiatric: He has a normal mood and affect.  Nursing note and vitals reviewed.    UC Treatments / Results  Labs (all labs ordered are listed, but only abnormal results are displayed) Labs Reviewed - No data to display  EKG  EKG Interpretation None  Radiology No results found.  Procedures Procedures (including critical care time)  Medications Ordered in UC Medications - No data to display   Initial Impression / Assessment and Plan / UC Course  I have reviewed the triage vital signs and the nursing notes.  Pertinent labs & imaging results that were available during my care of the patient were reviewed by me and considered in my medical decision making (see chart for details).     Patient appears to have chronic sinusitis, multiple shorter courses of antibiotics that have not provided relief.  We will try a longer course of Augmentin for 30 days.  Advised to take Zyrtec-D.  Follow-up with ENT.  Also provided prednisone for wheezing and asthma.  Continue nebulizers/inhalers at home. Discussed strict return precautions. Patient verbalized understanding and is agreeable with plan.   Final Clinical Impressions(s) / UC Diagnoses   Final diagnoses:  Chronic sinusitis, unspecified location    ED Discharge Orders        Ordered    amoxicillin-clavulanate (AUGMENTIN) 875-125 MG tablet  2 times daily     07/14/17 1347    predniSONE (STERAPRED UNI-PAK 21 TAB) 10 MG (21) TBPK tablet  Daily     07/14/17 1347       Controlled Substance Prescriptions [place] Controlled Substance Registry consulted? Not Applicable   Lew Dawes, [place] 07/15/17 [phone]

## 2017-07-14 NOTE — ED Triage Notes (Signed)
PT C/O: persistent cold/sinus inf onset 4 months +++   Sx today also include wheezing  A&O x4... NAD... Ambulatory

## 2017-07-14 NOTE — Discharge Instructions (Signed)
Please take Augmentin twice daily for 30 days.  Please take prednisone taper pack to help with asthma.  Continue inhaler or nebulizers as needed every 6 hours.  Do not take both.  Please try Zyrtec D twice daily.  Please follow-up with ENT for further evaluation of your sinus problems.

## 2017-10-23 ENCOUNTER — Other Ambulatory Visit: Payer: Self-pay

## 2017-10-23 ENCOUNTER — Ambulatory Visit (HOSPITAL_COMMUNITY)
Admission: EM | Admit: 2017-10-23 | Discharge: 2017-10-23 | Disposition: A | Discharge status: Home / Self Care | Payer: Self-pay | Attending: Physician Assistant | Admitting: Physician Assistant

## 2017-10-23 ENCOUNTER — Encounter (HOSPITAL_COMMUNITY): Payer: Self-pay | Admitting: Emergency Medicine

## 2017-10-23 DIAGNOSIS — J329 Chronic sinusitis, unspecified: Secondary | ICD-10-CM

## 2017-10-23 MED ORDER — AMOXICILLIN-POT CLAVULANATE 875-125 MG PO TABS: 1.0000 | ORAL_TABLET | Freq: Two times a day (BID) | ORAL | 0 refills | Status: DC

## 2017-10-23 MED ORDER — CETIRIZINE-PSEUDOEPHEDRINE ER 5-120 MG PO TB12: 1.0000 | ORAL_TABLET | Freq: Two times a day (BID) | ORAL | 0 refills | Status: AC

## 2017-10-23 NOTE — ED Provider Notes (Signed)
10/23/2017 3:19 PM   DOB: February 28, 1956 / MRN: [id number]  SUBJECTIVE:  Philip Peters is a 62 y.o. male presenting for sinus congestion x3 days.  Patient denies fever, teeth pain, headache.  He associates sneezing.  Tells me this happened to him last year and he was placed on antibiotics for 1 month before he got better.  He started taking Flonase yesterday otherwise he is tried nothing.  He has No Known Allergies.   He  has a past medical history of Allergy, Arthritis, and Asthma.    He  reports that he quit smoking about 13 years ago. He has a 70.00 pack-year smoking history. He has never used smokeless tobacco. He reports that he drinks alcohol. He reports that he does not use drugs. He  reports that he currently engages in sexual activity. The patient  has a past surgical history that includes left shoulder surgery; Foreign Body Removal (Right, 05/22/2014); and Lumbar fusion (N/A, 11/11/2015).  His family history includes Cancer in his sister; Diabetes in his father.  ROS per HPI  OBJECTIVE:  BP (!) 134/91 (BP Location: Right Arm) Comment: reported BP to Nurse Kim Lapan-Hutches  Pulse 80   Temp 97.8 F (36.6 C) (Oral)   Resp 18   SpO2 95%   Wt Readings from Last 3 Encounters:  04/28/16 230 lb (104.3 kg)  04/08/16 230 lb (104.3 kg)  03/15/16 232 lb 6.4 oz (105.4 kg)   Temp Readings from Last 3 Encounters:  10/23/17 97.8 F (36.6 C) (Oral)  07/14/17 (!) 97.5 F (36.4 C) (Oral)  06/10/17 97.8 F (36.6 C) (Oral)   BP Readings from Last 3 Encounters:  10/23/17 (!) 134/91  07/14/17 125/63  06/10/17 128/81   Pulse Readings from Last 3 Encounters:  10/23/17 80  07/14/17 75  06/10/17 78    Physical Exam  Constitutional: He is oriented to person, place, and time. He appears well-developed. He does not appear ill.  HENT:  Head: Normocephalic.  Right Ear: External ear normal.  Left Ear: External ear normal.  Nose: Nose normal.  Mouth/Throat: Oropharynx is clear and moist.   Eyes: Pupils are equal, round, and reactive to light. Conjunctivae and EOM are normal.  Cardiovascular: Normal rate.  Pulmonary/Chest: Effort normal.  Abdominal: He exhibits no distension.  Musculoskeletal: Normal range of motion.  Lymphadenopathy:    He has no cervical adenopathy.  Neurological: He is alert and oriented to person, place, and time. No cranial nerve deficit. Coordination normal.  Skin: Skin is warm and dry. He is not diaphoretic.  Psychiatric: He has a normal mood and affect.  Nursing note and vitals reviewed.   No results found for this or any previous visit (from the past 72 hour(s)).  No results found.  ASSESSMENT AND PLAN:   Chronic sinusitis, unspecified location    Discharge Instructions     The symptoms are most likely secondary to allergies.  Please take the medications as I prescribed them.  Please do not fill the antibiotic for the next 5 days while you are trying some other things.  If you just do not get better than it is okay to try antibiotics.        The patient is advised to call or return to clinic if he does not see an improvement in symptoms, or to seek the care of the closest emergency department if he worsens with the above plan.   Deliah Boston, MHS, PA-C 10/23/2017 3:19 PM   Ofilia Neas, PA-C  10/23/17 1519  

## 2017-10-23 NOTE — ED Triage Notes (Signed)
Stuffy nose, headache, sneezing.

## 2017-10-23 NOTE — Discharge Instructions (Signed)
The symptoms are most likely secondary to allergies.  Please take the medications as I prescribed them.  Please do not fill the antibiotic for the next 5 days while you are trying some other things.  If you just do not get better than it is okay to try antibiotics.

## 2017-11-29 ENCOUNTER — Encounter (HOSPITAL_COMMUNITY): Payer: Self-pay | Admitting: Emergency Medicine

## 2017-11-29 ENCOUNTER — Other Ambulatory Visit: Payer: Self-pay

## 2017-11-29 ENCOUNTER — Ambulatory Visit (HOSPITAL_COMMUNITY)
Admission: EM | Admit: 2017-11-29 | Discharge: 2017-11-29 | Disposition: A | Discharge status: Home / Self Care | Payer: Self-pay | Attending: Family Medicine | Admitting: Family Medicine

## 2017-11-29 DIAGNOSIS — J329 Chronic sinusitis, unspecified: Secondary | ICD-10-CM

## 2017-11-29 MED ORDER — PREDNISONE 10 MG (21) PO TBPK: ORAL_TABLET | Freq: Every day | ORAL | 0 refills | Status: DC

## 2017-11-29 MED ORDER — CETIRIZINE-PSEUDOEPHEDRINE ER 5-120 MG PO TB12: 1.0000 | ORAL_TABLET | Freq: Two times a day (BID) | ORAL | 0 refills | Status: DC

## 2017-11-29 MED ORDER — AMOXICILLIN-POT CLAVULANATE 875-125 MG PO TABS: 1.0000 | ORAL_TABLET | Freq: Two times a day (BID) | ORAL | 0 refills | Status: AC

## 2017-11-29 NOTE — ED Provider Notes (Signed)
[facility]    CSN: [id number] Arrival date & time: 11/29/17  [id number]     History   Chief Complaint Chief Complaint  Patient presents with  . Cough    HPI Philip Peters is a 62 y.o. male history of asthma, allergic rhinitis and chronic sinusitis presenting today for evaluation of nasal congestion.  Patient states that he has had nasal congestion for the past 3 to 4 weeks.  Is having significant difficulty breathing at night due to congestion.  Occasional coughing.  He has dealt with this off and on for a while.  Is tried many over-the-counter medicines without relief.  He uses Flonase daily.  Is previously used Zyrtec.  Will occasionally use Afrin.  In February he is put on a 30-day course of Augmentin as well as prednisone which he states this is helped him the most and this made his symptoms go away for the longest.  He also notes recently that he has had worsening of his asthma, increased wheezing.  Denies shortness of breath or chest pain.  Denies fevers.  HPI  Past Medical History:  Diagnosis Date  . Allergy   . Arthritis   . Asthma     Patient Active Problem List   Diagnosis Date Noted  . Encounter for screening and preventative care 03/15/2016  . Spinal stenosis, lumbar region, with neurogenic claudication 11/11/2015    Class: Chronic  . Degenerative disc disease, lumbar 11/11/2015    Class: Chronic  . Spondylolisthesis of lumbar region 11/11/2015    Class: Chronic  . Lumbar degenerative disc disease 11/11/2015  . Asthma, moderate persistent 08/01/2014    Past Surgical History:  Procedure Laterality Date  . FOREIGN BODY REMOVAL Right 05/22/2014   Procedure: FOREIGN BODY REMOVAL ADULT RIGHT INDEX;  Surgeon: Cindee SaltGary Kuzma, MD;  Location: [facility];  Service: Orthopedics;  Laterality: Right;  . left shoulder surgery    . LUMBAR FUSION N/A 11/11/2015   Procedure: TRANSFORAMINAL LUMBAR INTERBODY FUSIONS L4-5 AND L5-S1 WITH CAGES, PEDICLE SCREWS AND  RODS;  Surgeon: Kerrin ChampagneJames E Nitka, MD;  Location: [facility];  Service: Orthopedics;  Laterality: N/A;       Home Medications    Prior to Admission medications   Medication Sig Start Date End Date Taking? Authorizing Provider  aspirin 81 MG chewable tablet Chew 81 mg by mouth daily.   Yes [provider]  fluticasone (FLONASE) 50 MCG/ACT nasal spray Place into both nostrils daily.   Yes [provider]  amoxicillin-clavulanate (AUGMENTIN) 875-125 MG tablet Take 1 tablet by mouth every 12 (twelve) hours. 11/29/17 12/29/17  Pio Eatherly C, PA-C  cetirizine-pseudoephedrine (ZYRTEC-D) 5-120 MG tablet Take 1 tablet by mouth 2 (two) times daily. 11/29/17   Ismael Treptow C, PA-C  predniSONE (STERAPRED UNI-PAK 21 TAB) 10 MG (21) TBPK tablet Take by mouth daily. Please take 6 tablets on day 1, 5 tablets on day 2, 4 on day 3, 3 on day 4, 2 on day 5, 1 on day 6 11/29/17   Tashi Andujo, [place] PlainHallie C, PA-C    Family History Family History  Problem Relation Age of Onset  . Diabetes Father   . Cancer Sister   . Colon cancer Neg Hx   . Colon polyps Neg Hx     Social History Social History   Tobacco Use  . Smoking status: Former Smoker    Packs/day: 2.00    Years: 35.00    Pack years: 70.00    Last attempt  to quit: 01/27/2004    Years since quitting: 13.8  . Smokeless tobacco: Never Used  Substance Use Topics  . Alcohol use: Yes    Comment: 3 cans per week  . Drug use: No     Allergies   Patient has no known allergies.   Review of Systems Review of Systems  Constitutional: Negative for activity change, appetite change, chills, fatigue and fever.  HENT: Positive for congestion, rhinorrhea and sinus pressure. Negative for ear pain, sore throat and trouble swallowing.   Eyes: Negative for discharge and redness.  Respiratory: Positive for cough and wheezing. Negative for chest tightness and shortness of breath.   Cardiovascular: Negative for chest pain.  Gastrointestinal: Negative for  abdominal pain, diarrhea, nausea and vomiting.  Musculoskeletal: Negative for myalgias.  Skin: Negative for rash.  Neurological: Negative for dizziness, light-headedness and headaches.     Physical Exam Triage Vital Signs ED Triage Vitals  Enc Vitals Group     BP      Pulse      Resp      Temp      Temp src      SpO2      Weight      Height      Head Circumference      Peak Flow      Pain Score      Pain Loc      Pain Edu?      Excl. in GC?    No data found.  Updated Vital Signs BP (!) 136/97 (BP Location: Right Arm)   Pulse 85   Temp 97.7 F (36.5 C) (Oral)   Resp 20   SpO2 97%   Visual Acuity Right Eye Distance:   Left Eye Distance:   Bilateral Distance:    Right Eye Near:   Left Eye Near:    Bilateral Near:     Physical Exam  Constitutional: He appears well-developed and well-nourished.  HENT:  Head: Normocephalic and atraumatic.  Bilateral ears without tenderness to palpation of external auricle, tragus and mastoid, EAC's without erythema or swelling, TM's with good bony landmarks and cone of light. Non erythematous.  Nasal mucosa erythematous, swollen turbinates, rhinorrhea present in bilateral nares  Oral mucosa pink and moist, no tonsillar enlargement or exudate. Posterior pharynx patent and erythematous, no uvula deviation or swelling. Normal phonation.   Eyes: Conjunctivae are normal.  Neck: Neck supple.  Cardiovascular: Normal rate and regular rhythm.  No murmur heard. Pulmonary/Chest: Effort normal. No respiratory distress. He has wheezes.  Mild end expiratory wheezing throughout bilateral lung fields  Abdominal: Soft. There is no tenderness.  Musculoskeletal: He exhibits no edema.  Neurological: He is alert.  Skin: Skin is warm and dry.  Psychiatric: He has a normal mood and affect.  Nursing note and vitals reviewed.    UC Treatments / Results  Labs (all labs ordered are listed, but only abnormal results are displayed) Labs Reviewed  - No data to display  EKG None  Radiology No results found.  Procedures Procedures (including critical care time)  Medications Ordered in UC Medications - No data to display  Initial Impression / Assessment and Plan / UC Course  I have reviewed the triage vital signs and the nursing notes.  Pertinent labs & imaging results that were available during my care of the patient were reviewed by me and considered in my medical decision making (see chart for details).     Patient with chronic sinusitis as  well as asthma flare.  Will put on Augmentin, prednisone.  Continue symptomatic treatment with Flonase and Zyrtec-D.  Follow-up with ENT, discussed risks of frequent antibiotic use.Discussed strict return precautions. Patient verbalized understanding and is agreeable with plan.  Final Clinical Impressions(s) / UC Diagnoses   Final diagnoses:  Chronic sinusitis, unspecified location     Discharge Instructions     Please contact ear nose and throat to set up an appointment  Please begin taking Augmentin  Please begin prednisone taper over the next 6 days  Please continue your Flonase, please add in Zyrtec-D or other allergy/decongestant combination.  Please return if symptoms worsening, developing fever, difficulty breathing, shortness of breath.    ED Prescriptions    Medication Sig Dispense Auth. Provider   amoxicillin-clavulanate (AUGMENTIN) 875-125 MG tablet Take 1 tablet by mouth every 12 (twelve) hours. 60 tablet Hope Brandenburger C, PA-C   predniSONE (STERAPRED UNI-PAK 21 TAB) 10 MG (21) TBPK tablet Take by mouth daily. Please take 6 tablets on day 1, 5 tablets on day 2, 4 on day 3, 3 on day 4, 2 on day 5, 1 on day 6 21 tablet Yuliya Nova C, PA-C   cetirizine-pseudoephedrine (ZYRTEC-D) 5-120 MG tablet Take 1 tablet by mouth 2 (two) times daily. 30 tablet Petrice Beedy, [place] C, PA-C     Controlled Substance Prescriptions [place] Controlled Substance Registry consulted? Not  Applicable   Lew Dawes, [place] 11/29/17 1033

## 2017-11-29 NOTE — Discharge Instructions (Addendum)
Please contact ear nose and throat to set up an appointment  Please begin taking Augmentin  Please begin prednisone taper over the next 6 days  Please continue your Flonase, please add in Zyrtec-D or other allergy/decongestant combination.  Please return if symptoms worsening, developing fever, difficulty breathing, shortness of breath.

## 2017-11-29 NOTE — ED Triage Notes (Signed)
[facility] a [facility] of [facility] cough, chest congestion, nasal congestion and sneezing x 3 weeks.

## 2018-03-27 ENCOUNTER — Other Ambulatory Visit: Payer: Self-pay | Admitting: Urology

## 2018-03-27 DIAGNOSIS — Z125 Encounter for screening for malignant neoplasm of prostate: Secondary | ICD-10-CM

## 2018-03-27 NOTE — Progress Notes (Signed)
Patient: Philip Peters           Date of Birth: Sep 02, 1955           MRN: [id number] Visit Date: 03/27/2018 PCP: Patient, No Pcp Per  Prostate Cancer Screening Date of last physical exam: 01/30/16 Date of last rectal exam: 01/30/16 Have you ever had or been told you have an allergy to latex products?: No Are you currently taking any natural prostate preparations?: No Are you currently experiencing any urinary symptoms?: Yes If yes, please explain:: Urinary Frequency  Prostate Exam Digital Rectal Exam Results: Normal      Patient's History Patient Active Problem List   Diagnosis Date Noted  . Encounter for screening and preventative care 03/15/2016  . Spinal stenosis, lumbar region, with neurogenic claudication 11/11/2015    Class: Chronic  . Degenerative disc disease, lumbar 11/11/2015    Class: Chronic  . Spondylolisthesis of lumbar region 11/11/2015    Class: Chronic  . Lumbar degenerative disc disease 11/11/2015  . Asthma, moderate persistent 08/01/2014   Past Medical History:  Diagnosis Date  . Allergy   . Arthritis   . Asthma     Family History  Problem Relation Age of Onset  . Diabetes Father   . Cancer Sister   . Colon cancer Neg Hx   . Colon polyps Neg Hx     Past Surgical History:  Procedure Laterality Date  . FOREIGN BODY REMOVAL Right 05/22/2014   Procedure: FOREIGN BODY REMOVAL ADULT RIGHT INDEX;  Surgeon: Cindee Salt, MD;  Location: [facility];  Service: Orthopedics;  Laterality: Right;  . left shoulder surgery    . LUMBAR FUSION N/A 11/11/2015   Procedure: TRANSFORAMINAL LUMBAR INTERBODY FUSIONS L4-5 AND L5-S1 WITH CAGES, PEDICLE SCREWS AND RODS;  Surgeon: Kerrin Champagne, MD;  Location: [facility];  Service: Orthopedics;  Laterality: N/A;   Social History   Occupational History  . Not on file  Tobacco Use  . Smoking status: Former Smoker    Packs/day: 2.00    Years: 35.00    Pack years: 70.00    Last attempt to quit: 01/27/2004   Years since quitting: 14.1  . Smokeless tobacco: Never Used  Substance and Sexual Activity  . Alcohol use: Yes    Comment: 3 cans per week  . Drug use: No  . Sexual activity: Yes

## 2018-03-28 LAB — PSA: PROSTATE SPECIFIC AG, SERUM: 0.5 ng/mL (ref 0.0–4.0)

## 2018-04-14 ENCOUNTER — Telehealth (HOSPITAL_COMMUNITY): Payer: Self-pay | Admitting: *Deleted

## 2018-04-14 NOTE — Telephone Encounter (Signed)
Called patient and let him know that his PSA result was normal. Told patient he will need a PSA screening in one year. Patient verbalized understanding.

## 2018-06-06 ENCOUNTER — Ambulatory Visit (INDEPENDENT_AMBULATORY_CARE_PROVIDER_SITE_OTHER): Payer: Self-pay | Admitting: Surgery

## 2018-06-27 ENCOUNTER — Other Ambulatory Visit: Payer: Self-pay | Admitting: Family

## 2018-06-27 DIAGNOSIS — Z87891 Personal history of nicotine dependence: Secondary | ICD-10-CM

## 2018-06-30 ENCOUNTER — Other Ambulatory Visit: Payer: Self-pay | Admitting: Family

## 2018-06-30 DIAGNOSIS — Z87891 Personal history of nicotine dependence: Secondary | ICD-10-CM

## 2018-07-03 ENCOUNTER — Other Ambulatory Visit: Payer: Self-pay | Admitting: Family

## 2018-07-03 ENCOUNTER — Ambulatory Visit
Admission: RE | Admit: 2018-07-03 | Discharge: 2018-07-03 | Disposition: A | Discharge status: Home / Self Care | Payer: Medicare Other | Source: Ambulatory Visit | Attending: Family | Admitting: Family

## 2018-07-03 DIAGNOSIS — Z87891 Personal history of nicotine dependence: Secondary | ICD-10-CM

## 2018-07-31 ENCOUNTER — Ambulatory Visit (INDEPENDENT_AMBULATORY_CARE_PROVIDER_SITE_OTHER): Payer: Self-pay | Admitting: Specialist

## 2019-02-07 ENCOUNTER — Emergency Department (HOSPITAL_COMMUNITY)
Admission: EM | Admit: 2019-02-07 | Discharge: 2019-02-08 | Disposition: A | Discharge status: Home / Self Care | Payer: Medicare Other | Attending: Emergency Medicine | Admitting: Emergency Medicine

## 2019-02-07 ENCOUNTER — Emergency Department (HOSPITAL_COMMUNITY): Payer: Medicare Other

## 2019-02-07 ENCOUNTER — Other Ambulatory Visit: Payer: Self-pay

## 2019-02-07 DIAGNOSIS — Z87891 Personal history of nicotine dependence: Secondary | ICD-10-CM | POA: Diagnosis not present

## 2019-02-07 DIAGNOSIS — Z20828 Contact with and (suspected) exposure to other viral communicable diseases: Secondary | ICD-10-CM | POA: Diagnosis not present

## 2019-02-07 DIAGNOSIS — Z8709 Personal history of other diseases of the respiratory system: Secondary | ICD-10-CM | POA: Diagnosis not present

## 2019-02-07 DIAGNOSIS — Z7982 Long term (current) use of aspirin: Secondary | ICD-10-CM | POA: Diagnosis not present

## 2019-02-07 DIAGNOSIS — R0602 Shortness of breath: Secondary | ICD-10-CM | POA: Diagnosis not present

## 2019-02-07 LAB — CBC WITH DIFFERENTIAL/PLATELET
Abs Immature Granulocytes: 0.02 10*3/uL (ref 0.00–0.07)
Basophils Absolute: 0 10*3/uL (ref 0.0–0.1)
Basophils Relative: 0 %
Eosinophils Absolute: 0 10*3/uL (ref 0.0–0.5)
Eosinophils Relative: 1 %
HCT: 49.6 % (ref 39.0–52.0)
Hemoglobin: 16.2 g/dL (ref 13.0–17.0)
Immature Granulocytes: 0 %
Lymphocytes Relative: 21 %
Lymphs Abs: 1.2 10*3/uL (ref 0.7–4.0)
MCH: 28.9 pg (ref 26.0–34.0)
MCHC: 32.7 g/dL (ref 30.0–36.0)
MCV: 88.6 fL (ref 80.0–100.0)
Monocytes Absolute: 0.3 10*3/uL (ref 0.1–1.0)
Monocytes Relative: 5 %
Neutro Abs: 4.1 10*3/uL (ref 1.7–7.7)
Neutrophils Relative %: 73 %
Platelets: 144 10*3/uL — ABNORMAL LOW (ref 150–400)
RBC: 5.6 MIL/uL (ref 4.22–5.81)
RDW: 12.4 % (ref 11.5–15.5)
WBC: 5.6 10*3/uL (ref 4.0–10.5)
nRBC: 0 % (ref 0.0–0.2)

## 2019-02-07 LAB — COMPREHENSIVE METABOLIC PANEL
ALT: 33 U/L (ref 0–44)
AST: 39 U/L (ref 15–41)
Albumin: 2.6 g/dL — ABNORMAL LOW (ref 3.5–5.0)
Alkaline Phosphatase: 47 U/L (ref 38–126)
Anion gap: 12 (ref 5–15)
BUN: 11 mg/dL (ref 8–23)
CO2: 19 mmol/L — ABNORMAL LOW (ref 22–32)
Calcium: 7.6 mg/dL — ABNORMAL LOW (ref 8.9–10.3)
Chloride: 100 mmol/L (ref 98–111)
Creatinine, Ser: 1.17 mg/dL (ref 0.61–1.24)
GFR calc Af Amer: >60 mL/min (ref >60)
GFR calc non Af Amer: >60 mL/min (ref >60)
Glucose, Bld: 112 mg/dL — ABNORMAL HIGH (ref 70–99)
Potassium: 3.7 mmol/L (ref 3.5–5.1)
Sodium: 131 mmol/L — ABNORMAL LOW (ref 135–145)
Total Bilirubin: 0.8 mg/dL (ref 0.3–1.2)
Total Protein: 6.2 g/dL — ABNORMAL LOW (ref 6.5–8.1)

## 2019-02-07 NOTE — ED Triage Notes (Signed)
Pt states he get sob with exertion, no issues when he is at rest. Pt states he has had the same symptoms previously. Denies COVID exposure. No audible wheezing or distress noted in triage.

## 2019-02-08 MED ORDER — PREDNISONE 10 MG (21) PO TBPK: ORAL_TABLET | Freq: Every day | ORAL | 0 refills | Status: DC

## 2019-02-08 NOTE — Discharge Instructions (Signed)
Your chest x-ray today was concerning for viral pneumonia.  We are unable to exclude coronavirus as the cause of your symptoms, but you have a coronavirus test pending.  This should result in approximately 48 hours.  In the interim, you have been placed back on a course of steroids for 5 days.  Continue use of Mucinex for management of cough as well as the inhaler prescribed by your primary doctor.  You should have a repeat evaluation done by your primary care doctor in approximately 1 week.  You may return to the ED for new or concerning symptoms.

## 2019-02-08 NOTE — ED Notes (Signed)
Ambulated pt in hallway, pt's o2 sats stayed around 94/95, lowest was 92%. On RA.

## 2019-02-08 NOTE — ED Notes (Signed)
Family at bedside. 

## 2019-02-08 NOTE — ED Provider Notes (Signed)
[facility]ENC[facility] Provider Note   CSN: [id number] Arrival date & time: 02/07/19  1816     History   Chief Complaint Chief Complaint  Patient presents with  . Shortness of Breath    HPI Philip Peters is a 63 y.o. male.     63 y/o male with hx of asthma and arthritis presents to the emergency department with complaints of shortness of breath.  He states that his shortness of breath has been ongoing over the past 2 weeks.  It has been worsening over the past 1 week.  He states that his symptoms were initially associated with wheezing.  He called his primary care doctor who gave him a 5-day course of prednisone.  This improved his wheezing, but not his shortness of breath.  Called back his primary care doctor yesterday who prescribed Symbicort.  His shortness of breath is exacerbated by exertion; not present while at rest.  He has had a slight cough and feels that there is residual mucus in his chest.  He has tried Mucinex for this with little relief.  No sick contacts, fevers, nasal congestion, body aches, vomiting, diarrhea, chest pain, leg swelling, hemoptysis.  Denies any recent surgeries or hospitalizations.  Reports having similar episodes of SOB since 1973 which will often resolve spontaneously.  The history is provided by the patient. No language interpreter was used.  Shortness of Breath   Past Medical History:  Diagnosis Date  . Allergy   . Arthritis   . Asthma     Patient Active Problem List   Diagnosis Date Noted  . Encounter for screening and preventative care 03/15/2016  . Spinal stenosis, lumbar region, with neurogenic claudication 11/11/2015    Class: Chronic  . Degenerative disc disease, lumbar 11/11/2015    Class: Chronic  . Spondylolisthesis of lumbar region 11/11/2015    Class: Chronic  . Lumbar degenerative disc disease 11/11/2015  . Asthma, moderate persistent 08/01/2014    Past Surgical History:  Procedure Laterality  Date  . FOREIGN BODY REMOVAL Right 05/22/2014   Procedure: FOREIGN BODY REMOVAL ADULT RIGHT INDEX;  Surgeon: Cindee SaltGary Kuzma, MD;  Location: [facility];  Service: Orthopedics;  Laterality: Right;  . left shoulder surgery    . LUMBAR FUSION N/A 11/11/2015   Procedure: TRANSFORAMINAL LUMBAR INTERBODY FUSIONS L4-5 AND L5-S1 WITH CAGES, PEDICLE SCREWS AND RODS;  Surgeon: Kerrin ChampagneJames E Nitka, MD;  Location: [facility] OR;  Service: Orthopedics;  Laterality: N/A;        Home Medications    Prior to Admission medications   Medication Sig Start Date End Date Taking? Authorizing Provider  aspirin 81 MG chewable tablet Chew 81 mg by mouth daily.    [provider]  cetirizine-pseudoephedrine (ZYRTEC-D) 5-120 MG tablet Take 1 tablet by mouth 2 (two) times daily. 11/29/17   Wieters, Hallie C, PA-C  fluticasone (FLONASE) 50 MCG/ACT nasal spray Place into both nostrils daily.    [provider]  predniSONE (STERAPRED UNI-PAK 21 TAB) 10 MG (21) TBPK tablet Take by mouth daily. Please take 6 tablets on day 1, 5 tablets on day 2, 4 on day 3, 3 on day 4, 2 on day 5, 1 on day 6 02/08/19   Antony MaduraHumes, Gable Odonohue, PA-C    Family History Family History  Problem Relation Age of Onset  . Diabetes Father   . Cancer Sister   . Colon cancer Neg Hx   . Colon polyps Neg Hx  Social History Social History   Tobacco Use  . Smoking status: Former Smoker    Packs/day: 2.00    Years: 35.00    Pack years: 70.00    Quit date: 01/27/2004    Years since quitting: 15.0  . Smokeless tobacco: Never Used  Substance Use Topics  . Alcohol use: Yes    Comment: 3 cans per week  . Drug use: No     Allergies   Patient has no known allergies.   Review of Systems Review of Systems  Respiratory: Positive for shortness of breath.   Ten systems reviewed and are negative for acute change, except as noted in the HPI.    Physical Exam Updated Vital Signs BP (!) 128/96   Pulse 94   Temp 98.3 F (36.8 C)  (Oral)   Resp (!) 23   SpO2 95%   Physical Exam Vitals signs and nursing note reviewed.  Constitutional:      General: He is not in acute distress.    Appearance: He is well-developed. He is not diaphoretic.     Comments: Nontoxic appearing and in NAD  HENT:     Head: Normocephalic and atraumatic.  Eyes:     General: No scleral icterus.    Conjunctiva/sclera: Conjunctivae normal.  Neck:     Musculoskeletal: Normal range of motion.  Cardiovascular:     Rate and Rhythm: Normal rate and regular rhythm.     Pulses: Normal pulses.  Pulmonary:     Effort: Pulmonary effort is normal. No respiratory distress.     Breath sounds: No stridor. No wheezing or rales.     Comments: Lungs CTAB. Respirations even and unlabored. SpO2 94-95% at rest. Musculoskeletal: Normal range of motion.     Comments: No BLE edema.  Skin:    General: Skin is warm and dry.     Coloration: Skin is not pale.     Findings: No erythema or rash.  Neurological:     Mental Status: He is alert and oriented to person, place, and time.     Coordination: Coordination normal.  Psychiatric:        Behavior: Behavior normal.      ED Treatments / Results  Labs (all labs ordered are listed, but only abnormal results are displayed) Labs Reviewed  COMPREHENSIVE METABOLIC PANEL - Abnormal; Notable for the following components:      Result Value   Sodium 131 (*)    CO2 19 (*)    Glucose, Bld 112 (*)    Calcium 7.6 (*)    Total Protein 6.2 (*)    Albumin 2.6 (*)    All other components within normal limits  CBC WITH DIFFERENTIAL/PLATELET - Abnormal; Notable for the following components:   Platelets 144 (*)    All other components within normal limits  NOVEL CORONAVIRUS, NAA (HOSP ORDER, SEND-OUT TO REF LAB; TAT 18-24 HRS)    EKG EKG Interpretation  Date/Time:  Wednesday February 07 2019 18:34:08 EDT Ventricular Rate:  111 PR Interval:  134 QRS Duration: 74 QT Interval:  324 QTC Calculation: 440 R Axis:    -14 Text Interpretation:  Sinus tachycardia Otherwise normal ECG NO STEMI. No old tracing to compare Confirmed by Drema Pry [phone]) on 02/08/2019 2:21:05 AM   Radiology Dg Chest 2 View  Result Date: 02/07/2019 CLINICAL DATA:  Shortness of breath EXAM: CHEST - 2 VIEW COMPARISON:  September 16, 2015 FINDINGS: The heart and mediastinum are normal. There is mildly increased streaky airspace  opacity in the periphery of the right lower lung and left lower lung. No acute osseous abnormality. IMPRESSION: The findings in the lungs are nonspecific, but concerning for atypical infection, which includes viral pneumonia. Electronically Signed   By: Prudencio Pair M.D.   On: 02/07/2019 19:40    Procedures Procedures (including critical care time)  Medications Ordered in ED Medications - No data to display   3:10 AM Well's PE score is 0. Ambulated in the hallway with c/o dyspnea; sats at or above 92% with ambulation.   Initial Impression / Assessment and Plan / ED Course  I have reviewed the triage vital signs and the nursing notes.  Pertinent labs & imaging results that were available during my care of the patient were reviewed by me and considered in my medical decision making (see chart for details).        63 year old male presents the emergency department for complaints of shortness of breath x 2 weeks associated with a cough and chest congestion.  Noted to have a low-grade temperature on arrival which has spontaneously improved.  Vital signs have been stable.  No hypoxia.  Work-up without leukocytosis or electrolyte derangements.  His chest x-ray is concerning for mild changes consistent with viral/atypical pneumonia.  Patient denies any known sick contacts, but will order send out coronavirus test.  Advised to remain in quarantine until his test results.  Patient discharged with 6 day prednisone taper; encouraged f/u with his PCP.  Return precautions discussed and provided.  Patient discharged in  stable condition with no unaddressed concerns.  Philip Peters was evaluated in Emergency Department on 02/08/2019 for the symptoms described in the history of present illness. He was evaluated in the context of the global COVID-19 pandemic, which necessitated consideration that the patient might be at risk for infection with the SARS-CoV-2 virus that causes COVID-19. Institutional protocols and algorithms that pertain to the evaluation of patients at risk for COVID-19 are in a state of rapid change based on information released by regulatory bodies including the CDC and federal and state organizations. These policies and algorithms were followed during the patient's care in the ED.   Final Clinical Impressions(s) / ED Diagnoses   Final diagnoses:  Shortness of breath    ED Discharge Orders         Ordered    predniSONE (STERAPRED UNI-PAK 21 TAB) 10 MG (21) TBPK tablet  Daily     02/08/19 0346           Antonietta Breach, PA-C 02/08/19 0505    Fatima Blank, MD 02/08/19 [phone]

## 2019-02-09 LAB — NOVEL CORONAVIRUS, NAA (HOSP ORDER, SEND-OUT TO REF LAB; TAT 18-24 HRS): SARS-CoV-2, NAA: NOT DETECTED

## 2019-07-22 ENCOUNTER — Ambulatory Visit: Payer: Medicare Other | Attending: Internal Medicine

## 2019-07-22 DIAGNOSIS — Z23 Encounter for immunization: Secondary | ICD-10-CM | POA: Insufficient documentation

## 2019-07-22 NOTE — Progress Notes (Signed)
   Covid-19 Vaccination Clinic  Name:  Philip Peters    MRN: [id number] DOB: Oct 21, 1955  07/22/2019  Philip Peters was observed post Covid-19 immunization for 15 minutes without incidence. He was provided with Vaccine Information Sheet and instruction to access the V-Safe system.   Philip Peters was instructed to call 911 with any severe reactions post vaccine: Marland Kitchen Difficulty breathing  . Swelling of your face and throat  . A fast heartbeat  . A bad rash all over your body  . Dizziness and weakness    Immunizations Administered    Name Date Dose VIS Date Route   Pfizer COVID-19 Vaccine 07/22/2019  3:30 PM 0.3 mL 05/11/2019 Intramuscular   Manufacturer: [employer], [employer]   Lot: [id number]   NDC: [address]-7

## 2019-08-15 ENCOUNTER — Ambulatory Visit: Payer: Medicare Other | Attending: Internal Medicine

## 2019-08-15 DIAGNOSIS — Z23 Encounter for immunization: Secondary | ICD-10-CM

## 2019-08-15 NOTE — Progress Notes (Signed)
   Covid-19 Vaccination Clinic  Name:  CLAYBURN WEEKLY    MRN: [id number] DOB: 1955/08/23  08/15/2019  Mr. Maya was observed post Covid-19 immunization for 15 minutes without incident. He was provided with Vaccine Information Sheet and instruction to access the V-Safe system.   Mr. Ciavarella was instructed to call 911 with any severe reactions post vaccine: Marland Kitchen Difficulty breathing  . Swelling of face and throat  . A fast heartbeat  . A bad rash all over body  . Dizziness and weakness   Immunizations Administered    Name Date Dose VIS Date Route   Pfizer COVID-19 Vaccine 08/15/2019  3:24 PM 0.3 mL 05/11/2019 Intramuscular   Manufacturer: [employer], [employer]   Lot: [id number]   NDC: [address]-7

## 2019-09-19 ENCOUNTER — Ambulatory Visit: Payer: Medicare Other | Admitting: Podiatry

## 2019-09-19 ENCOUNTER — Other Ambulatory Visit: Payer: Self-pay

## 2019-09-19 DIAGNOSIS — M79674 Pain in right toe(s): Secondary | ICD-10-CM

## 2019-09-19 DIAGNOSIS — M79675 Pain in left toe(s): Secondary | ICD-10-CM | POA: Diagnosis not present

## 2019-09-19 DIAGNOSIS — B351 Tinea unguium: Secondary | ICD-10-CM | POA: Diagnosis not present

## 2019-09-19 DIAGNOSIS — L603 Nail dystrophy: Secondary | ICD-10-CM

## 2019-09-20 ENCOUNTER — Encounter: Payer: Self-pay | Admitting: Podiatry

## 2019-09-20 NOTE — Progress Notes (Signed)
Subjective:  Patient ID: Philip Peters, male    DOB: August 19, 1955,  MRN: [id number]  Chief Complaint  Patient presents with  . Foot Pain    pt is here for right great toenail pain of the right great toenail, pt states that it is painful when putting socks on.    64 y.o. male presents with the above complaint.  Patient presents with right thickened dystrophic elongated toenails that are painful in nature x10.  However his right hallux is the most painful he has been going for 5 years has progressively gotten worse.  The pain is across the nail itself from medial to lateral direction.  Pain scale 6 out of 10.  There is no oozing or purulent drainage however there is some swelling associated with it.  Patient is a diabetic with last A1c of 5.6.  He denies any other acute complaints.  He would like to discuss treatment options for onychomycosis especially of the right hallux.   Review of Systems: Negative except as noted in the HPI. Denies N/V/F/Ch.  Past Medical History:  Diagnosis Date  . Allergy   . Arthritis   . Asthma     Current Outpatient Medications:  .  aspirin 81 MG chewable tablet, Chew 81 mg by mouth daily., Disp: , Rfl:  .  cetirizine-pseudoephedrine (ZYRTEC-D) 5-120 MG tablet, Take 1 tablet by mouth 2 (two) times daily., Disp: 30 tablet, Rfl: 0 .  fluticasone (FLONASE) 50 MCG/ACT nasal spray, Place into both nostrils daily., Disp: , Rfl:  .  predniSONE (STERAPRED UNI-PAK 21 TAB) 10 MG (21) TBPK tablet, Take by mouth daily. Please take 6 tablets on day 1, 5 tablets on day 2, 4 on day 3, 3 on day 4, 2 on day 5, 1 on day 6, Disp: 21 tablet, Rfl: 0  Social History   Tobacco Use  Smoking Status Former Smoker  . Packs/day: 2.00  . Years: 35.00  . Pack years: 70.00  . Quit date: 01/27/2004  . Years since quitting: 15.6  Smokeless Tobacco Never Used    No Known Allergies Objective:  There were no vitals filed for this visit. There is no height or weight on file to calculate  BMI. Constitutional Well developed. Well nourished.  Vascular Dorsalis pedis pulses palpable bilaterally. Posterior tibial pulses palpable bilaterally. Capillary refill normal to all digits.  No cyanosis or clubbing noted. Pedal hair growth normal.  Neurologic Normal speech. Oriented to person, place, and time. Epicritic sensation to light touch grossly present bilaterally.  Dermatologic Pain on palpation of the entire/total nail on 1st digit of the right.  Thickened elongated dystrophic mycotic discolored toenails x10.  They are all painful in nature with the right hallux being the most painful. No other open wounds. No skin lesions.  Orthopedic: Normal joint ROM without pain or crepitus bilaterally. No visible deformities. No bony tenderness.   Radiographs: None Assessment:  No diagnosis found. Plan:  Patient was evaluated and treated and all questions answered.  Nail contusion/dystrophy hallux, right -Patient elects to proceed with minor surgery to remove entire toenail today. Consent reviewed and signed by patient. -Entire/total nail excised. See procedure note. -Educated on post-procedure care including soaking. Written instructions provided and reviewed. -Patient to follow up in 2 weeks for nail check.  Procedure: Excision of entire/total nail  Location: Right 1st toe digit Anesthesia: Lidocaine 1% plain; 1.5 mL and Marcaine 0.5% plain; 1.5 mL, digital block. Skin Prep: Betadine. Dressing: Silvadene; telfa; dry, sterile, compression dressing. Technique: Following  skin prep, the toe was exsanguinated and a tourniquet was secured at the base of the toe. The affected nail border was freed and excised. The tourniquet was then removed and sterile dressing applied. Disposition: Patient tolerated procedure well. Patient to return in 2 weeks for follow-up.    Onychomycosis with pain  -Nails palliatively debrided as below. -Educated on self-care  Procedure: Nail  Debridement Rationale: pain  Type of Debridement: manual, sharp debridement. Instrumentation: Nail nipper, rotary burr. Number of Nails: 9  Procedures and Treatment: Consent by patient was obtained for treatment procedures. The patient understood the discussion of treatment and procedures well. All questions were answered thoroughly reviewed. Debridement of mycotic and hypertrophic toenails, 1 through 5 bilateral and clearing of subungual debris. No ulceration, no infection noted.  Return Visit-Office Procedure: Patient instructed to return to the office for a follow up visit 3 months for continued evaluation and treatment.  Boneta Lucks, DPM    No follow-ups on file.   No follow-ups on file.

## 2019-09-25 ENCOUNTER — Telehealth: Payer: Self-pay | Admitting: *Deleted

## 2019-09-25 NOTE — Telephone Encounter (Signed)
I spoke with pt and Rinaldo Cloud and she states pt was having some pain. I informed them that the toe would be red, swollen and draining for about 2-3 weeks and to continue the epsom salt soaks and antibiotic ointment, and if the swelling, redness or pain increased to contact our office. Pt states the swelling has gone down and it is only a little red. I told him it may be that was for 2-3  Weeks and if able to take ibuprofen OTC take as the package instructs. Pt states understanding.

## 2019-09-25 NOTE — Telephone Encounter (Signed)
Pt's wife, Dub Amis states she has some questions about pt's toe after the toenail procedure.

## 2019-12-06 ENCOUNTER — Other Ambulatory Visit: Payer: Self-pay | Admitting: Family

## 2019-12-06 DIAGNOSIS — M25511 Pain in right shoulder: Secondary | ICD-10-CM

## 2019-12-13 ENCOUNTER — Other Ambulatory Visit: Payer: Self-pay | Admitting: Family

## 2019-12-16 ENCOUNTER — Other Ambulatory Visit: Payer: Medicare Other

## 2019-12-19 ENCOUNTER — Ambulatory Visit (INDEPENDENT_AMBULATORY_CARE_PROVIDER_SITE_OTHER): Payer: Medicare (Managed Care) | Admitting: Podiatry

## 2019-12-19 ENCOUNTER — Other Ambulatory Visit: Payer: Self-pay

## 2019-12-19 ENCOUNTER — Encounter: Payer: Self-pay | Admitting: Podiatry

## 2019-12-19 DIAGNOSIS — B351 Tinea unguium: Secondary | ICD-10-CM

## 2019-12-19 DIAGNOSIS — M79674 Pain in right toe(s): Secondary | ICD-10-CM

## 2019-12-19 DIAGNOSIS — M79675 Pain in left toe(s): Secondary | ICD-10-CM | POA: Diagnosis not present

## 2019-12-23 NOTE — Progress Notes (Signed)
Subjective:  Patient ID: Philip Peters, male    DOB: Feb 25, 1956,  MRN: [id number]  Philip Peters presents to clinic today for painful thick toenails that are difficult to trim. Pain interferes with ambulation. Aggravating factors include wearing enclosed shoe gear. Pain is relieved with periodic professional debridement..  64 y.o. male presents with the above complaint.  Reports painfully elongated nails to both feet.  Review of Systems: Negative except as noted in the HPI. Past Medical History:  Diagnosis Date   Allergy    Arthritis    Asthma    Past Surgical History:  Procedure Laterality Date   FOREIGN BODY REMOVAL Right 05/22/2014   Procedure: FOREIGN BODY REMOVAL ADULT RIGHT INDEX;  Surgeon: Cindee Salt, MD;  Location: [facility];  Service: Orthopedics;  Laterality: Right;   left shoulder surgery     LUMBAR FUSION N/A 11/11/2015   Procedure: TRANSFORAMINAL LUMBAR INTERBODY FUSIONS L4-5 AND L5-S1 WITH CAGES, PEDICLE SCREWS AND RODS;  Surgeon: Kerrin Champagne, MD;  Location: [facility];  Service: Orthopedics;  Laterality: N/A;    Current Outpatient Medications:    aspirin 81 MG chewable tablet, Chew 81 mg by mouth daily., Disp: , Rfl:    cetirizine-pseudoephedrine (ZYRTEC-D) 5-120 MG tablet, Take 1 tablet by mouth 2 (two) times daily., Disp: 30 tablet, Rfl: 0   diclofenac Sodium (VOLTAREN) 1 % GEL, SMARTSIG:2 Gram(s) Topical 4 Times Daily PRN, Disp: , Rfl:    famotidine (PEPCID) 20 MG tablet, Take 20 mg by mouth 3 (three) times daily as needed., Disp: , Rfl:    fluticasone (FLONASE) 50 MCG/ACT nasal spray, Place into both nostrils daily., Disp: , Rfl:    glipiZIDE (GLUCOTROL) 10 MG tablet, Take 10 mg by mouth 2 (two) times daily., Disp: , Rfl:    meloxicam (MOBIC) 15 MG tablet, Take 15 mg by mouth daily., Disp: , Rfl:    metFORMIN (GLUCOPHAGE) 500 MG tablet, Take 500 mg by mouth daily., Disp: , Rfl:    montelukast (SINGULAIR) 10 MG tablet, Take 10 mg by mouth  daily., Disp: , Rfl:    predniSONE (STERAPRED UNI-PAK 21 TAB) 10 MG (21) TBPK tablet, Take by mouth daily. Please take 6 tablets on day 1, 5 tablets on day 2, 4 on day 3, 3 on day 4, 2 on day 5, 1 on day 6, Disp: 21 tablet, Rfl: 0   sildenafil (VIAGRA) 50 MG tablet, , Disp: , Rfl:    tamsulosin (FLOMAX) 0.4 MG CAPS capsule, Take 0.4 mg by mouth daily., Disp: , Rfl:    traMADol (ULTRAM) 50 MG tablet, Take 50 mg by mouth every 6 (six) hours as needed., Disp: , Rfl:  No Known Allergies Social History   Occupational History   Not on file  Tobacco Use   Smoking status: Former Smoker    Packs/day: 2.00    Years: 35.00    Pack years: 70.00    Quit date: 01/27/2004    Years since quitting: 15.9   Smokeless tobacco: Never Used  Substance and Sexual Activity   Alcohol use: Yes    Comment: 3 cans per week   Drug use: No   Sexual activity: Yes    Objective:   Constitutional Philip Peters is a pleasant 64 y.o. African American male, morbidly obese in NAD.Marland Kitchen AAO x 3.   Vascular Dorsalis pedis pulses palpable bilaterally. Posterior tibial pulses palpable bilaterally. Capillary refill normal to all digits.  No cyanosis or clubbing noted. Pedal hair growth sparse  b/l. Lower extremity skin temperature gradient within normal limits. No pain with calf compression b/l. No edema noted b/l lower extremities.  Neurologic Normal speech. Oriented to person, place, and time. Epicritic sensation to light touch grossly present bilaterally. Protective sensation intact 5/5 intact bilaterally with 10g monofilament b/l. Clonus negative b/l.  Dermatologic Pedal skin with normal turgor, texture and tone b/l.  Toenails are discolored, thick, elongated, dystrophic with pain on palpation x 10 No open wounds. No skin lesions. Pedal skin with normal turgor, texture and tone bilaterally. No open wounds bilaterally. No interdigital macerations bilaterally. Toenails 2-5 bilaterally and L hallux elongated,  discolored, dystrophic, thickened, and crumbly with subungual debris and tenderness to dorsal palpation. Anonychia noted R hallux. Nailbed(s) epithelialized.   Orthopedic: Normal muscle strength 5/5 to all lower extremity muscle groups bilaterally. No pain crepitus or joint limitation noted with ROM b/l. No gross bony deformities bilaterally. No bony tenderness.    Radiographs: None Assessment:   1. Pain due to onychomycosis of toenails of both feet    Plan:  Patient was evaluated and treated and all questions answered.  Onychomycosis with pain -Nails palliatively debridement as below -Educated on self-care  Procedure: Nail Debridement Rationale: Pain Type of Debridement: manual, sharp debridement. Instrumentation: Nail nipper, rotary burr. Number of Nails: 9 -Examined patient. -No new findings. No new orders. -Toenails 2-5 bilaterally and L hallux debrided in length and girth without iatrogenic bleeding with sterile nail nipper and dremel.  -Patient to report any pedal injuries to medical professional immediately. -Patient to continue soft, supportive shoe gear daily. -Patient/POA to call should there be question/concern in the interim.  Return in about 3 months (around 03/20/2020) for nail trim.  Freddie Breech, DPM

## 2020-01-10 ENCOUNTER — Ambulatory Visit: Payer: Medicare Other | Admitting: Specialist

## 2020-01-20 ENCOUNTER — Other Ambulatory Visit: Payer: Medicare (Managed Care)

## 2020-02-07 ENCOUNTER — Ambulatory Visit: Payer: Medicare (Managed Care) | Admitting: Specialist

## 2020-02-13 ENCOUNTER — Ambulatory Visit
Admission: RE | Admit: 2020-02-13 | Discharge: 2020-02-13 | Disposition: A | Discharge status: Home / Self Care | Payer: Medicare Other | Source: Ambulatory Visit | Attending: Family | Admitting: Family

## 2020-02-13 ENCOUNTER — Other Ambulatory Visit: Payer: Self-pay

## 2020-02-13 DIAGNOSIS — M25511 Pain in right shoulder: Secondary | ICD-10-CM

## 2020-02-21 ENCOUNTER — Ambulatory Visit: Payer: Self-pay

## 2020-02-21 ENCOUNTER — Encounter: Payer: Self-pay | Admitting: Specialist

## 2020-02-21 ENCOUNTER — Other Ambulatory Visit: Payer: Self-pay

## 2020-02-21 ENCOUNTER — Ambulatory Visit (INDEPENDENT_AMBULATORY_CARE_PROVIDER_SITE_OTHER): Payer: Medicare Other | Admitting: Specialist

## 2020-02-21 VITALS — BP 126/81 | HR 83 | Ht 72.5 in | Wt 233.0 lb

## 2020-02-21 DIAGNOSIS — M75121 Complete rotator cuff tear or rupture of right shoulder, not specified as traumatic: Secondary | ICD-10-CM | POA: Diagnosis not present

## 2020-02-21 DIAGNOSIS — M25511 Pain in right shoulder: Secondary | ICD-10-CM

## 2020-02-21 NOTE — Patient Instructions (Addendum)
Avoid overhead lifting and overhead use of the arms. Do not lift greater than 5 lbs. Adjust head rest in vehicle to prevent hyperextension if rear ended. Take extra precautions to avoid falling. Mobic 15 mg daily  Physical therapy for mobilizing shoulder and strengthening.

## 2020-02-21 NOTE — Progress Notes (Signed)
Office Visit Note   Patient: Philip Peters           Date of Birth: 01-22-1956           MRN: [id number] Visit Date: 02/21/2020              Requested by: Raymon Mutton., FNP [address] [place],  [place] [address] PCP: Raymon Mutton., FNP   Assessment & Plan: Visit Diagnoses:  1. Right shoulder pain, unspecified chronicity     Plan: Avoid overhead lifting and overhead use of the arms. Do not lift greater than 5 lbs. Adjust head rest in vehicle to prevent hyperextension if rear ended. Take extra precautions to avoid falling.  Follow-Up Instructions: No follow-ups on file.   Orders:  Orders Placed This Encounter  Procedures  . XR Shoulder Right   No orders of the defined types were placed in this encounter.     Procedures: No procedures performed   Clinical Data: No additional findings.   Subjective: Chief Complaint  Patient presents with  . Right Shoulder - Pain    64 year old male with right shoulder pain, pain is present with lying on the right shoulder with raising it he feels a pop. Some occasional feeling of cool or tingling. No really weak but painful with pulling to start a Psychologist, prison and probation services. No standing or or walking issues. There is sometimes right posterior neck pain.   Review of Systems  Constitutional: Negative for activity change, appetite change, chills, diaphoresis, fatigue, fever and unexpected weight change.  HENT: Negative.  Negative for congestion, dental problem, drooling, ear discharge, ear pain, facial swelling, hearing loss, mouth sores, nosebleeds, postnasal drip, rhinorrhea, sinus pressure, sinus pain, sneezing, sore throat, tinnitus, trouble swallowing and voice change.   Eyes: Negative.  Negative for photophobia, pain, discharge, redness, itching and visual disturbance.  Respiratory: Negative.  Negative for apnea, cough, choking, chest tightness, shortness of breath, wheezing and stridor.   Cardiovascular: Negative.  Negative  for chest pain, palpitations and leg swelling.  Gastrointestinal: Negative.  Negative for abdominal distention, abdominal pain, anal bleeding, blood in stool, constipation, diarrhea, nausea, rectal pain and vomiting.  Endocrine: Negative.  Negative for cold intolerance, heat intolerance, polydipsia, polyphagia and polyuria.  Genitourinary: Negative.  Negative for difficulty urinating, dysuria, enuresis, flank pain and hematuria.  Musculoskeletal: Negative.  Negative for arthralgias, back pain, gait problem, joint swelling, myalgias, neck pain and neck stiffness.  Skin: Negative.  Negative for color change, pallor, rash and wound.  Allergic/Immunologic: Negative.  Negative for environmental allergies, food allergies and immunocompromised state.  Neurological: Negative.  Negative for dizziness, tremors, seizures, syncope, facial asymmetry, speech difficulty, weakness, light-headedness, numbness and headaches.  Hematological: Negative.  Negative for adenopathy. Does not bruise/bleed easily.  Psychiatric/Behavioral: Negative.  Negative for agitation, behavioral problems, confusion, decreased concentration, dysphoric mood, hallucinations, self-injury, sleep disturbance and suicidal ideas. The patient is not nervous/anxious and is not hyperactive.      Objective: Vital Signs: BP 126/81 (BP Location: Left Arm, Patient Position: Sitting)   Pulse 83   Ht 6' 0.5" (1.842 m)   Wt 233 lb (105.7 kg)   BMI 31.17 kg/m   Physical Exam Constitutional:      Appearance: He is well-developed.  HENT:     Head: Normocephalic and atraumatic.  Eyes:     Pupils: Pupils are equal, round, and reactive to light.  Pulmonary:     Effort: Pulmonary effort is normal.  Breath sounds: Normal breath sounds.  Abdominal:     General: Bowel sounds are normal.     Palpations: Abdomen is soft.  Musculoskeletal:     Cervical back: Normal range of motion and neck supple.  Skin:    General: Skin is warm and dry.    Neurological:     Mental Status: He is alert and oriented to person, place, and time.  Psychiatric:        Behavior: Behavior normal.        Thought Content: Thought content normal.        Judgment: Judgment normal.     Back Exam   Tenderness  The patient is experiencing tenderness in the lumbar.   Right Shoulder Exam   Range of Motion  Active abduction: abnormal  Extension: abnormal  External rotation: abnormal  Forward flexion: abnormal  Internal rotation 0 degrees: normal  Internal rotation 90 degrees: normal   Muscle Strength  Abduction: 4/5  Internal rotation: 5/5  External rotation: 5/5  Supraspinatus: 4/5  Subscapularis: 4/5  Biceps: 4/5   Tests  Apprehension: negative Hawkins test: negative Cross arm: negative Impingement: positive Drop arm: positive Sulcus: absent  Other  Erythema: absent Scars: absent Sensation: normal Pulse: present   Left Shoulder Exam   Muscle Strength  Abduction: 5/5  Internal rotation: 5/5  External rotation: 5/5  Supraspinatus: 5/5  Subscapularis: 5/5  Biceps: 5/5       Specialty Comments:  No specialty comments available.  Imaging: No results found.   PMFS History: Patient Active Problem List   Diagnosis Date Noted  . Spinal stenosis, lumbar region, with neurogenic claudication 11/11/2015    Priority: High    Class: Chronic  . Degenerative disc disease, lumbar 11/11/2015    Priority: High    Class: Chronic  . Spondylolisthesis of lumbar region 11/11/2015    Priority: High    Class: Chronic  . Encounter for screening and preventative care 03/15/2016  . Lumbar degenerative disc disease 11/11/2015  . Asthma, moderate persistent 08/01/2014   Past Medical History:  Diagnosis Date  . Allergy   . Arthritis   . Asthma     Family History  Problem Relation Age of Onset  . Diabetes Father   . Cancer Sister   . Colon cancer Neg Hx   . Colon polyps Neg Hx     Past Surgical History:  Procedure  Laterality Date  . FOREIGN BODY REMOVAL Right 05/22/2014   Procedure: FOREIGN BODY REMOVAL ADULT RIGHT INDEX;  Surgeon: Cindee Salt, MD;  Location: [facility];  Service: Orthopedics;  Laterality: Right;  . left shoulder surgery    . LUMBAR FUSION N/A 11/11/2015   Procedure: TRANSFORAMINAL LUMBAR INTERBODY FUSIONS L4-5 AND L5-S1 WITH CAGES, PEDICLE SCREWS AND RODS;  Surgeon: Kerrin Champagne, MD;  Location: [facility];  Service: Orthopedics;  Laterality: N/A;   Social History   Occupational History  . Not on file  Tobacco Use  . Smoking status: Former Smoker    Packs/day: 2.00    Years: 35.00    Pack years: 70.00    Quit date: 01/27/2004    Years since quitting: 16.0  . Smokeless tobacco: Never Used  Substance and Sexual Activity  . Alcohol use: Yes    Comment: 3 cans per week  . Drug use: No  . Sexual activity: Yes

## 2020-03-06 ENCOUNTER — Other Ambulatory Visit: Payer: Self-pay

## 2020-03-06 ENCOUNTER — Encounter: Payer: Self-pay | Admitting: Physical Therapy

## 2020-03-06 ENCOUNTER — Ambulatory Visit (INDEPENDENT_AMBULATORY_CARE_PROVIDER_SITE_OTHER): Payer: Medicare Other | Admitting: Physical Therapy

## 2020-03-06 DIAGNOSIS — M25611 Stiffness of right shoulder, not elsewhere classified: Secondary | ICD-10-CM | POA: Diagnosis not present

## 2020-03-06 DIAGNOSIS — M25511 Pain in right shoulder: Secondary | ICD-10-CM

## 2020-03-06 DIAGNOSIS — R293 Abnormal posture: Secondary | ICD-10-CM | POA: Diagnosis not present

## 2020-03-06 DIAGNOSIS — M6281 Muscle weakness (generalized): Secondary | ICD-10-CM | POA: Diagnosis not present

## 2020-03-06 DIAGNOSIS — G8929 Other chronic pain: Secondary | ICD-10-CM

## 2020-03-06 NOTE — Therapy (Signed)
[facility] [address] [place], [place], [address] Phone: [phone]   Fax:  [phone]  Physical Therapy Evaluation  Patient Details  Name: Philip Peters MRN: [id number] Date of Birth: 10-Aug-1955 Referring Provider (PT): Dr Otelia SergeantNitka   Encounter Date: 03/06/2020   PT End of Session - 03/06/20 1152    Visit Number 1    Number of Visits 8    Date for PT Re-Evaluation 04/03/20    Authorization Type UHC MRC    PT Start Time 1149    PT Stop Time 1228    PT Time Calculation (min) 39 min    Activity Tolerance Patient tolerated treatment well    Behavior During Therapy [facility] for tasks assessed/performed           Past Medical History:  Diagnosis Date  . Allergy   . Arthritis   . Asthma     Past Surgical History:  Procedure Laterality Date  . FOREIGN BODY REMOVAL Right 05/22/2014   Procedure: FOREIGN BODY REMOVAL ADULT RIGHT INDEX;  Surgeon: Cindee SaltGary Kuzma, MD;  Location: [facility];  Service: Orthopedics;  Laterality: Right;  . left shoulder surgery    . LUMBAR FUSION N/A 11/11/2015   Procedure: TRANSFORAMINAL LUMBAR INTERBODY FUSIONS L4-5 AND L5-S1 WITH CAGES, PEDICLE SCREWS AND RODS;  Surgeon: Kerrin ChampagneJames E Nitka, MD;  Location: [facility];  Service: Orthopedics;  Laterality: N/A;    There were no vitals filed for this visit.    Subjective Assessment - 03/06/20 1153    Subjective Pt reports intermittent pain in the Rt shoulder " when it feels like it" especially if he lies on the Rt side and trying to start his lawn mower.    Pertinent History Lt RTC repair > 5 yrs ago    Diagnostic tests xrays a few weeks ago and MRI    Patient Stated Goals help him to decide if he wants to have surgery or not.    Currently in Pain? Other (Comment)   minimal to no pain at rest   Pain Score 10-Worst pain ever    Pain Location Shoulder    Pain Orientation Right;Posterior    Pain Descriptors / Indicators Shooting;Sharp    Pain Type Chronic pain     Pain Onset More than a month ago    Pain Frequency Intermittent    Aggravating Factors  sleeping/ lying on the Rt arm wrong or moving it funny    Pain Relieving Factors be still and it settles down.              [facility] - 03/06/20 0001      Assessment   Medical Diagnosis Rt shoulder pain  RTC tear    Referring Provider (PT) Dr Otelia SergeantNitka    Onset Date/Surgical Date 03/07/15    Hand Dominance --   both   Next MD Visit --   some time in November   Prior Therapy not for this shoulder      Precautions   Precautions --   5# lifting      Balance Screen   Has the patient fallen in the past 6 months No    Has the patient had a decrease in activity level because of a fear of falling?  No    Is the patient reluctant to leave their home because of a fear of falling?  No      Home [employer] managernvironment   Living Environment Private residence  Prior Function   Level of Independence Independent   unable to donn socks since back surgery   Vocation Retired    Leisure sedentary hobbies, star gazing       Observation/Other Assessments   Focus on Therapeutic Outcomes (FOTO)  47% limited      Posture/Postural Control   Posture/Postural Control Postural limitations    Postural Limitations Forward head;Rounded Shoulders      ROM / Strength   AROM / PROM / Strength AROM;Strength      AROM   AROM Assessment Site Shoulder;Cervical;Elbow    Right/Left Shoulder Right   Lt WNL   Right Shoulder Extension 60 Degrees    Right Shoulder Flexion 120 Degrees   pain at endrange of Rt shoulder motion   Right Shoulder ABduction 120 Degrees    Right Shoulder Internal Rotation 75 Degrees    Right Shoulder External Rotation 60 Degrees    Right/Left Elbow --   WNL    Cervical Flexion to chest    Cervical Extension 30 with pain    Cervical - Right Rotation WNL    Cervical - Left Rotation WNL      Strength   Overall Strength Comments Rt supraspinatus 3-/5    Strength Assessment Site Shoulder;Elbow     Right/Left Shoulder Right   Lt 5/5   Right Shoulder Flexion 4+/5    Right Shoulder Extension 5/5    Right Shoulder ABduction 4-/5    Right Shoulder Internal Rotation 5/5    Right Shoulder External Rotation 4-/5    Right/Left Elbow --   5/5     Flexibility   Soft Tissue Assessment /Muscle Length --   bilat pecs tight      Palpation   Palpation comment some tenderness anterior Rt shoulder, palpable creptitis at acromion process with IR/ER       Special Tests    Special Tests Rotator Cuff Impingement    Rotator Cuff Impingment tests Leanord Asal test;Empty Can test      Hawkins-Kennedy test   Findings Positive    Side Right      Empty Can test   Findings Positive    Side Right                      Objective measurements completed on examination: See above findings.       [facility] Adult PT Treatment/Exercise - 03/06/20 0001      Exercises   Exercises Shoulder      Shoulder Exercises: Supine   Horizontal ABduction Strengthening;Both;15 reps;Theraband    Theraband Level (Shoulder Horizontal ABduction) Level 2 (Red)    External Rotation Strengthening;Both;15 reps;Theraband    Theraband Level (Shoulder External Rotation) Level 2 (Red)   VC for form   Flexion Strengthening;Both;15 reps;Theraband    Theraband Level (Shoulder Flexion) Level 2 (Red)      Shoulder Exercises: Stretch   Other Shoulder Stretches doorway stretch mid 45 sec each side                  PT Education - 03/06/20 1236    Education Details HEP and POC    Person(s) Educated Patient    Methods Explanation;Demonstration;Handout    Comprehension Returned demonstration;Verbalized understanding               PT Long Term Goals - 03/06/20 1244      PT LONG TERM GOAL #1   Title I with advanced HEP ( 04/03/2020)    Time  4    Period Weeks    Status New    Target Date 04/03/20      PT LONG TERM GOAL #2   Title report decreased Rt shoulder pain to allow him to sleep per his  previous level ( 04/03/2020)    Time 4    Period Weeks    Status New    Target Date 04/03/20      PT LONG TERM GOAL #3   Title improve FOTO =/< 35% limted ( 04/03/2020)    Time 4    Period Weeks    Status New    Target Date 04/03/20      PT LONG TERM GOAL #4   Title improve Rt shoulder strength 5/5 without pain to allow him to easily start a pull mower ( 04/03/2020)    Time 4    Period Weeks    Status New    Target Date 04/03/20      PT LONG TERM GOAL #5   Title report =/> 75% reduction of random Rt shoulder pain with daily activities ( 04/03/2020)    Time 4    Period Weeks    Status New    Target Date 04/03/20                  Plan - 03/06/20 1238    Clinical Impression Statement 64 yo male with h/o Rt shoulder pain that has gotten progressively worse.  He had an MRI that shows a full thickness supraspinatus tear and partial infraspinatus.  He doesnt want to have surgery unless he has to.  He had the Lt side repaired multiple years ago.  He is alos on disabilty after lumbar surgery.  Currently the Rt shoulder is limiting his sleep, making it hard for him to pull start a lawn mover and will give him pain at random times.  Kenard has painfull endrange abduction and ER and limited ROm throughout. He also has Rt shoulder weakness.  There are some postural changes with rounded shoulders, forward head and sunken chest.  He reported some relief after performing his HEP and would benefit from a trial of PT to see if sirgery can be avoided.    Personal Factors and Comorbidities Comorbidity 3+    Comorbidities see snapshot    Examination-Activity Limitations Lift;Carry;Reach Overhead   5# restrictions   Examination-Participation Restrictions Other;Yard Work    Stability/Clinical Decision Making Stable/Uncomplicated    Clinical Decision Making Low    Rehab Potential Good    PT Frequency 2x / week    PT Duration 4 weeks    PT Treatment/Interventions Iontophoresis 4mg /ml  Dexamethasone;Taping;Vasopneumatic Device;Patient/family education;Moist Heat;Passive range of motion;Therapeutic exercise;Cryotherapy;Electrical Stimulation;Manual techniques;Dry needling    PT Next Visit Plan scapular and RTC strengthening.    PT Home Exercise Plan Access Code: [email or web address]    Consulted and Agree with Plan of Care Patient           Patient will benefit from skilled therapeutic intervention in order to improve the following deficits and impairments:  Decreased range of motion, Impaired UE functional use, Pain, Postural dysfunction, Decreased strength  Visit Diagnosis: Chronic right shoulder pain - Plan: PT plan of care cert/re-cert  Muscle weakness (generalized) - Plan: PT plan of care cert/re-cert  Abnormal posture - Plan: PT plan of care cert/re-cert  Stiffness of right shoulder, not elsewhere classified - Plan: PT plan of care cert/re-cert     Problem List Patient Active Problem List   Diagnosis Date  Noted  . Encounter for screening and preventative care 03/15/2016  . Spinal stenosis, lumbar region, with neurogenic claudication 11/11/2015    Class: Chronic  . Degenerative disc disease, lumbar 11/11/2015    Class: Chronic  . Spondylolisthesis of lumbar region 11/11/2015    Class: Chronic  . Lumbar degenerative disc disease 11/11/2015  . Asthma, moderate persistent 08/01/2014    Roderic Scarce PT  03/06/2020, 12:49 PM  [facility] [address] [place], [place], [address] Phone: [phone]   Fax:  [phone]  Name: Philip Peters MRN: [id number] Date of Birth: 11/10/1955

## 2020-03-06 NOTE — Patient Instructions (Signed)
Access Code: [email or web address] URL: [email or web address] Date: 03/06/2020 Prepared by: Roderic Scarce  Exercises Supine Shoulder Flexion Extension AAROM with Dowel - 1 x daily - 1-2 sets - 15 reps Supine Shoulder Horizontal Abduction with Resistance - 1 x daily - 1-2 sets - 15 reps Supine Shoulder External Rotation with Resistance - 1 x daily - 1-2 sets - 15 reps Single Arm Doorway Pec Stretch at 90 Degrees Abduction - 1 x daily - 1 reps - 45 sec hold

## 2020-03-10 ENCOUNTER — Encounter: Payer: Medicare Other | Admitting: Rehabilitative and Restorative Service Providers"

## 2020-03-24 ENCOUNTER — Encounter: Payer: Self-pay | Admitting: Rehabilitative and Restorative Service Providers"

## 2020-03-24 ENCOUNTER — Ambulatory Visit (INDEPENDENT_AMBULATORY_CARE_PROVIDER_SITE_OTHER): Payer: Medicare Other | Admitting: Rehabilitative and Restorative Service Providers"

## 2020-03-24 ENCOUNTER — Other Ambulatory Visit: Payer: Self-pay

## 2020-03-24 DIAGNOSIS — R293 Abnormal posture: Secondary | ICD-10-CM | POA: Diagnosis not present

## 2020-03-24 DIAGNOSIS — M6281 Muscle weakness (generalized): Secondary | ICD-10-CM | POA: Diagnosis not present

## 2020-03-24 DIAGNOSIS — M25511 Pain in right shoulder: Secondary | ICD-10-CM

## 2020-03-24 DIAGNOSIS — M25611 Stiffness of right shoulder, not elsewhere classified: Secondary | ICD-10-CM | POA: Diagnosis not present

## 2020-03-24 DIAGNOSIS — G8929 Other chronic pain: Secondary | ICD-10-CM

## 2020-03-24 NOTE — Therapy (Signed)
[facility] Physical Therapy [address] [place], [place], [address] Phone: [phone]   Fax:  [phone]  Physical Therapy Treatment  Patient Details  Name: Philip Peters MRN: [id number] Date of Birth: 08/28/1955 Referring Provider (PT): Dr Otelia Sergeant   Encounter Date: 03/24/2020   PT End of Session - 03/24/20 0843    Visit Number 2    Number of Visits 8    Date for PT Re-Evaluation 04/03/20    Authorization Type UHC MRC    Progress Note Due on Visit 10    PT Start Time [phone]    PT Stop Time 0921    PT Time Calculation (min) 39 min    Activity Tolerance Patient tolerated treatment well    Behavior During Therapy [facility] for tasks assessed/performed           Past Medical History:  Diagnosis Date  . Allergy   . Arthritis   . Asthma     Past Surgical History:  Procedure Laterality Date  . FOREIGN BODY REMOVAL Right 05/22/2014   Procedure: FOREIGN BODY REMOVAL ADULT RIGHT INDEX;  Surgeon: Cindee Salt, MD;  Location: [facility];  Service: Orthopedics;  Laterality: Right;  . left shoulder surgery    . LUMBAR FUSION N/A 11/11/2015   Procedure: TRANSFORAMINAL LUMBAR INTERBODY FUSIONS L4-5 AND L5-S1 WITH CAGES, PEDICLE SCREWS AND RODS;  Surgeon: Kerrin Champagne, MD;  Location: [facility];  Service: Orthopedics;  Laterality: N/A;    There were no vitals filed for this visit.   Subjective Assessment - 03/24/20 0847    Subjective Pt. indicated consistent pain, today around 4/10 across top of Rt shoulder.  Pt. indicated sometimes the pain increases in level.    Pertinent History Lt RTC repair > 5 yrs ago    Diagnostic tests xrays a few weeks ago and MRI    Patient Stated Goals help him to decide if he wants to have surgery or not.    Currently in Pain? Yes    Pain Score 4     Pain Location Shoulder    Pain Orientation Right    Pain Descriptors / Indicators Burning;Stabbing    Pain Type Chronic pain    Pain Onset More than a month ago    Pain  Frequency Constant    Aggravating Factors  insidious, several hours after exercises    Pain Relieving Factors brief relief after exercises                             [facility] Adult PT Treatment/Exercise - 03/24/20 0001      Shoulder Exercises: Supine   Horizontal ABduction Strengthening;Both   3 x 10   Theraband Level (Shoulder Horizontal ABduction) Level 2 (Red)    External Rotation Strengthening;Both   2 x 10   Theraband Level (Shoulder External Rotation) Level 2 (Red)    Flexion Strengthening;15 reps    Theraband Level (Shoulder Flexion) Level 2 (Red)      Shoulder Exercises: Standing   Extension Strengthening;Both   3  x10   Theraband Level (Shoulder Extension) Level 3 (Green)    Occupational hygienist;Both   3 x 10   Theraband Level (Shoulder Row) Level 3 (Green)      Shoulder Exercises: ROM/Strengthening   UBE (Upper Arm Bike) Lvl 2.5 3 mins fwd/back each way      Manual Therapy   Manual therapy comments g2-g3 inferior jt mobs in flexion, scaption, MWM  c ER in 45 deg abd                       PT Long Term Goals - 03/06/20 1244      PT LONG TERM GOAL #1   Title I with advanced HEP ( 04/03/2020)    Time 4    Period Weeks    Status New    Target Date 04/03/20      PT LONG TERM GOAL #2   Title report decreased Rt shoulder pain to allow him to sleep per his previous level ( 04/03/2020)    Time 4    Period Weeks    Status New    Target Date 04/03/20      PT LONG TERM GOAL #3   Title improve FOTO =/< 35% limted ( 04/03/2020)    Time 4    Period Weeks    Status New    Target Date 04/03/20      PT LONG TERM GOAL #4   Title improve Rt shoulder strength 5/5 without pain to allow him to easily start a pull mower ( 04/03/2020)    Time 4    Period Weeks    Status New    Target Date 04/03/20      PT LONG TERM GOAL #5   Title report =/> 75% reduction of random Rt shoulder pain with daily activities ( 04/03/2020)    Time 4    Period Weeks     Status New    Target Date 04/03/20                 Plan - 03/24/20 0859    Clinical Impression Statement Mild end range stiffness noted in flexion, scaption and abduction c minimal pain indicated.  Overall Rt UE strengthening indicated at this time to improve performance in elevation and lifting/carrying    Personal Factors and Comorbidities Comorbidity 3+    Comorbidities see snapshot    Examination-Activity Limitations Lift;Carry;Reach Overhead   5# restrictions   Examination-Participation Restrictions Other;Yard Work    Stability/Clinical Decision Making Stable/Uncomplicated    Rehab Potential Good    PT Frequency 2x / week    PT Duration 4 weeks    PT Treatment/Interventions Iontophoresis 4mg /ml Dexamethasone;Taping;Vasopneumatic Device;Patient/family education;Moist Heat;Passive range of motion;Therapeutic exercise;Cryotherapy;Electrical Stimulation;Manual techniques;Dry needling    PT Next Visit Plan Continue overall UE strengthening.    PT Home Exercise Plan Access Code: [email or web address]    Consulted and Agree with Plan of Care Patient           Patient will benefit from skilled therapeutic intervention in order to improve the following deficits and impairments:  Decreased range of motion, Impaired UE functional use, Pain, Postural dysfunction, Decreased strength  Visit Diagnosis: Chronic right shoulder pain  Muscle weakness (generalized)  Abnormal posture  Stiffness of right shoulder, not elsewhere classified     Problem List Patient Active Problem List   Diagnosis Date Noted  . Encounter for screening and preventative care 03/15/2016  . Spinal stenosis, lumbar region, with neurogenic claudication 11/11/2015    Class: Chronic  . Degenerative disc disease, lumbar 11/11/2015    Class: Chronic  . Spondylolisthesis of lumbar region 11/11/2015    Class: Chronic  . Lumbar degenerative disc disease 11/11/2015  . Asthma, moderate persistent 08/01/2014   10/01/2014, PT, DPT, OCS, ATC 03/24/20  9:18 AM    [facility] Physical Therapy [address] [place], [place], [place] Phone: [phone]  Fax:  [phone]  Name: KRISTOF NADEEM MRN: [id number] Date of Birth: [birth year]

## 2020-03-26 ENCOUNTER — Ambulatory Visit: Payer: Medicare (Managed Care) | Admitting: Podiatry

## 2020-03-27 ENCOUNTER — Ambulatory Visit: Payer: Medicare Other | Admitting: Specialist

## 2020-04-03 ENCOUNTER — Encounter: Payer: Self-pay | Admitting: Rehabilitative and Restorative Service Providers"

## 2020-04-03 ENCOUNTER — Ambulatory Visit (INDEPENDENT_AMBULATORY_CARE_PROVIDER_SITE_OTHER): Payer: Medicare Other | Admitting: Rehabilitative and Restorative Service Providers"

## 2020-04-03 ENCOUNTER — Other Ambulatory Visit: Payer: Self-pay

## 2020-04-03 DIAGNOSIS — M25611 Stiffness of right shoulder, not elsewhere classified: Secondary | ICD-10-CM

## 2020-04-03 DIAGNOSIS — M25511 Pain in right shoulder: Secondary | ICD-10-CM | POA: Diagnosis not present

## 2020-04-03 DIAGNOSIS — G8929 Other chronic pain: Secondary | ICD-10-CM

## 2020-04-03 DIAGNOSIS — R293 Abnormal posture: Secondary | ICD-10-CM

## 2020-04-03 DIAGNOSIS — M6281 Muscle weakness (generalized): Secondary | ICD-10-CM | POA: Diagnosis not present

## 2020-04-03 NOTE — Therapy (Addendum)
[facility] [place], [place], [address] Phone: [phone]   Fax:  [phone]  Physical Therapy Treatment/Progress Note/ Recertification/Discharge  Patient Details  Name: Philip Peters MRN: [id number] Date of Birth: 1955/07/01 Referring Provider (PT): Dr Louanne Skye   Encounter Date: 04/03/2020   Progress Note Reporting Period 03/06/2020 to 04/03/2020 See note below for Objective Data and Assessment of Progress/Goals.        PT End of Session - 04/03/20 1105    Visit Number 3    Number of Visits 12    Date for PT Re-Evaluation 05/29/20    Authorization Type [employer]    Progress Note Due on Visit 13    PT Start Time 1100    PT Stop Time 1140    PT Time Calculation (min) 40 min    Activity Tolerance Patient tolerated treatment well    Behavior During Therapy WFL for tasks assessed/performed           Past Medical History:  Diagnosis Date  . Allergy   . Arthritis   . Asthma     Past Surgical History:  Procedure Laterality Date  . FOREIGN BODY REMOVAL Right 05/22/2014   Procedure: FOREIGN BODY REMOVAL ADULT RIGHT INDEX;  Surgeon: Daryll Brod, MD;  Location: [facility];  Service: Orthopedics;  Laterality: Right;  . left shoulder surgery    . LUMBAR FUSION N/A 11/11/2015   Procedure: TRANSFORAMINAL LUMBAR INTERBODY FUSIONS L4-5 AND L5-S1 WITH CAGES, PEDICLE SCREWS AND RODS;  Surgeon: Jessy Oto, MD;  Location: [facility];  Service: Orthopedics;  Laterality: N/A;    There were no vitals filed for this visit.   Subjective Assessment - 04/03/20 1104    Subjective Pt. indicated feeling various times of pain in arm, sometimes insidious, sometimes c reaching.  Pt. stated exercises can be difficult.    Pertinent History Lt RTC repair > 5 yrs ago    Diagnostic tests xrays a few weeks ago and MRI    Patient Stated Goals help him to decide if he wants to have surgery or not.    Currently in Pain? No/denies    Pain Score  0-No pain   no pain at rest indicated   Pain Descriptors / Indicators Burning;Aching;Sharp;Sore    Pain Type Chronic pain    Pain Onset More than a month ago    Pain Frequency Intermittent    Aggravating Factors  insidious at times, reaching/end range    Pain Relieving Factors rest              [facility] PT Assessment - 04/03/20 0001      Assessment   Medical Diagnosis Rt shoulder pain  RTC tear    Referring Provider (PT) Dr Louanne Skye    Onset Date/Surgical Date 03/07/15      Observation/Other Assessments   Focus on Therapeutic Outcomes (FOTO)  update: 60% status, expected 65%      AROM   Right Shoulder Flexion 150 Degrees   measured in supine   Right Shoulder ABduction 140 Degrees   measured in supine   Right Shoulder Internal Rotation 75 Degrees   measured in supine 45 deg abd   Right Shoulder External Rotation 70 Degrees   measured in supine 45 deg abd     Strength   Right Shoulder Flexion 4+/5    Right Shoulder ABduction 4/5    Right Shoulder Internal Rotation 5/5    Right Shoulder External Rotation 4+/5  [facility] PT Treatment/Exercise - 04/03/20 0001      Neuro Re-ed    Neuro Re-ed Details  rhythmic stabilization 100 deg flexion supine, moderate resistance 30 sec bouts      Shoulder Exercises: Sidelying   ABduction Right   3 x 15   ABduction Weight (lbs) 2      Shoulder Exercises: Standing   External Rotation Right;Strengthening   3 x 15   Theraband Level (Shoulder External Rotation) Level 4 (Blue)      Shoulder Exercises: Pulleys   Flexion 3 minutes    Scaption 3 minutes      Shoulder Exercises: ROM/Strengthening   UBE (Upper Arm Bike) lvl 3 3 mins fwd/back each way                  PT Education - 04/03/20 1117    Education Details POC education    Person(s) Educated Patient    Methods Explanation    Comprehension Verbalized understanding               PT Long Term Goals - 04/03/20 1115      PT  LONG TERM GOAL #1   Title I with advanced HEP ( 04/03/2020)    Baseline 04/03/2020: cues required    Time 4    Period Weeks    Status On-going      PT LONG TERM GOAL #2   Title report decreased Rt shoulder pain to allow him to sleep per his previous level    Time 8    Period Weeks    Status Revised    Target Date 05/29/20      PT LONG TERM GOAL #3   Title improve FOTO =/< 35% limted ( 04/03/2020)    Time 8    Period Weeks    Status Revised    Target Date 05/29/20      PT LONG TERM GOAL #4   Title improve Rt shoulder strength 5/5 without pain to allow him to easily start a pull mower ( 04/03/2020)    Time 8    Period Weeks    Status Revised    Target Date 05/29/20      PT LONG TERM GOAL #5   Title report =/> 75% reduction of random Rt shoulder pain with daily activities ( 04/03/2020)    Time 8    Period Weeks    Status Revised    Target Date 05/29/20                 Plan - 04/03/20 1115    Clinical Impression Statement Pt. has attended 3 visits overall during course of treatment.  See objective data for updated information. Despite subjective reported restriction in movement, passive/active mobility continued to show improvement in all directions c good tolerance in clinic. Pt. may continue to benefit from skilled PT services to improve strength and active mobility tolerance for daily activity.  Extension of POC due to deficits and recommended continued care.    Personal Factors and Comorbidities Comorbidity 3+    Comorbidities see snapshot    Examination-Activity Limitations Lift;Carry;Reach Overhead   5# restrictions   Examination-Participation Restrictions Other;Yard Work    Stability/Clinical Decision Making Stable/Uncomplicated    Rehab Potential Good    PT Frequency Other (comment)   1-2x/week   PT Duration 8 weeks    PT Treatment/Interventions Iontophoresis 52m/ml Dexamethasone;Taping;Vasopneumatic Device;Patient/family education;Moist Heat;Passive range of  motion;Therapeutic exercise;Cryotherapy;Electrical Stimulation;Manual techniques;Dry needling    PT Next Visit  Plan Additional time for continued use of skilled PT c strengthening focus    PT Home Exercise Plan Access Code: [email or web address]    Consulted and Agree with Plan of Care Patient           Patient will benefit from skilled therapeutic intervention in order to improve the following deficits and impairments:  Decreased range of motion, Impaired UE functional use, Pain, Postural dysfunction, Decreased strength  Visit Diagnosis: Chronic right shoulder pain  Muscle weakness (generalized)  Abnormal posture  Stiffness of right shoulder, not elsewhere classified     Problem List Patient Active Problem List   Diagnosis Date Noted  . Encounter for screening and preventative care 03/15/2016  . Spinal stenosis, lumbar region, with neurogenic claudication 11/11/2015    Class: Chronic  . Degenerative disc disease, lumbar 11/11/2015    Class: Chronic  . Spondylolisthesis of lumbar region 11/11/2015    Class: Chronic  . Lumbar degenerative disc disease 11/11/2015  . Asthma, moderate persistent 08/01/2014    Scot Jun, PT, DPT, OCS, ATC 04/03/20  11:38 AM  PHYSICAL THERAPY DISCHARGE SUMMARY  Visits from Start of Care: 3  Current functional level related to goals / functional outcomes: See note   Remaining deficits: See note   Education / Equipment: HEP Plan: Patient agrees to discharge.  Patient goals were partially met. Patient is being discharged due to not returning since the last visit.  ?????     Scot Jun, PT, DPT, OCS, ATC 05/19/20  2:25 PM    [facility] [address] [place], [place], [address] Phone: [phone]   Fax:  [phone]  Name: XUE LOW MRN: [id number] Date of Birth: 17-Jul-1955

## 2020-04-10 ENCOUNTER — Encounter: Payer: Medicare Other | Admitting: Rehabilitative and Restorative Service Providers"

## 2020-04-17 ENCOUNTER — Encounter: Payer: Medicare Other | Admitting: Rehabilitative and Restorative Service Providers"

## 2020-04-22 ENCOUNTER — Encounter: Payer: Medicare Other | Admitting: Rehabilitative and Restorative Service Providers"

## 2020-04-22 ENCOUNTER — Telehealth: Payer: Self-pay | Admitting: Rehabilitative and Restorative Service Providers"

## 2020-04-22 NOTE — Telephone Encounter (Signed)
Pt. Indicated forgetting appointment was scheduled today.  Reminded of next appointment.  Chyrel Masson, PT, DPT, OCS, ATC 04/22/20  9:04 AM

## 2020-05-01 ENCOUNTER — Encounter: Payer: Medicare Other | Admitting: Rehabilitative and Restorative Service Providers"

## 2020-05-01 ENCOUNTER — Other Ambulatory Visit: Payer: Self-pay | Admitting: Family

## 2020-05-01 ENCOUNTER — Ambulatory Visit
Admission: RE | Admit: 2020-05-01 | Discharge: 2020-05-01 | Disposition: A | Discharge status: Home / Self Care | Payer: Medicare Other | Source: Ambulatory Visit | Attending: Family | Admitting: Family

## 2020-05-01 DIAGNOSIS — R058 Other specified cough: Secondary | ICD-10-CM

## 2020-05-02 ENCOUNTER — Ambulatory Visit: Payer: Medicare Other | Admitting: Specialist

## 2020-05-08 ENCOUNTER — Encounter: Payer: Medicare Other | Admitting: Rehabilitative and Restorative Service Providers"

## 2020-06-02 ENCOUNTER — Ambulatory Visit: Payer: Medicare Other | Admitting: Specialist

## 2020-06-18 ENCOUNTER — Ambulatory Visit: Payer: Medicare Other | Admitting: Specialist

## 2022-03-16 IMAGING — MR MR SHOULDER*R* W/O CM
5 series · 36 of 40 positions shown · non-contrast
Comparison: Right shoulder x-rays dated July 04, 2015.

CLINICAL DATA: Chronic right shoulder pain. No injury or prior
surgery.

EXAM:
MRI OF THE RIGHT SHOULDER WITHOUT CONTRAST
TECHNIQUE: Multiplanar, multisequence MR imaging of the shoulder was performed.
No intravenous contrast was administered.

[Series 3: PD fat-sat · axial · 4.0mm · 0.55mm/px · z∈[-14,+69]mm · 8 of 20 slices shown (1 of 2)]
[im 1/20]
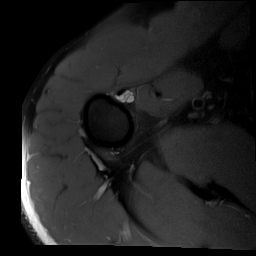
[im 3/20]
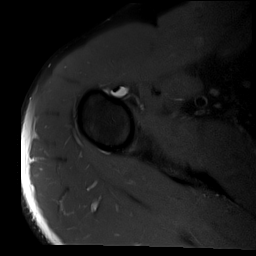
[im 7/20]
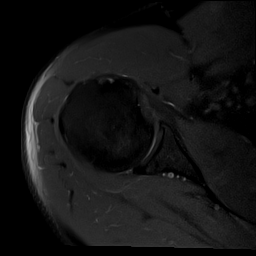
[im 9/20]
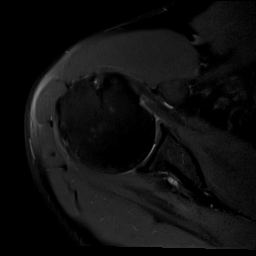
[im 11/20]
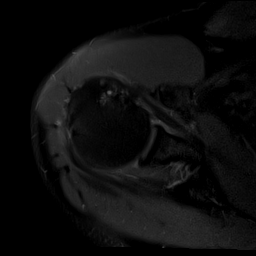
[im 13/20]
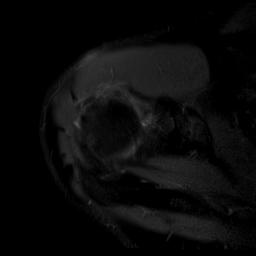
[im 17/20]
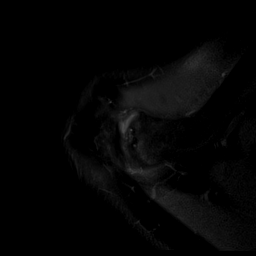
[im 20/20]
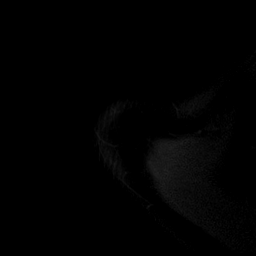

[Series 4: T2 fat-sat · oblique · 4.0mm · 0.59mm/px · 8 of 17 slices shown (1 of 2)]
[im 1/17]
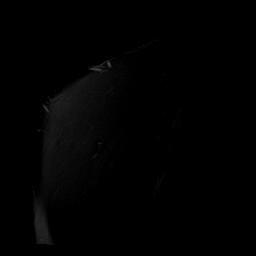
[im 3/17]
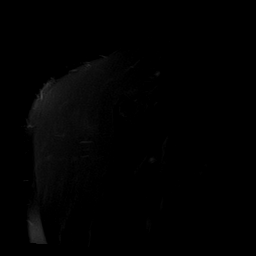
[im 5/17]
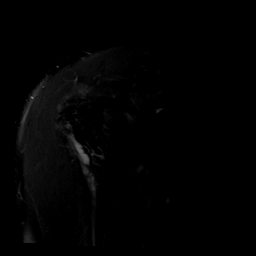
[im 7/17]
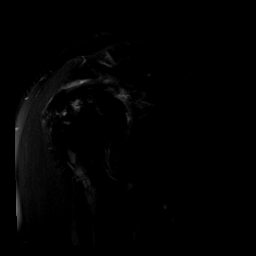
[im 10/17]
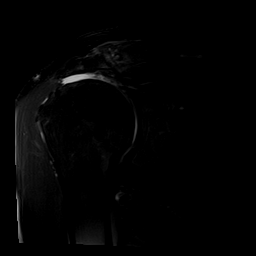
[im 12/17]
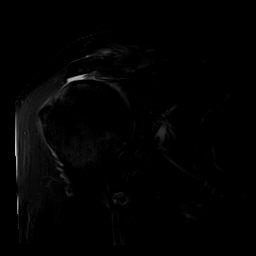
[im 14/17]
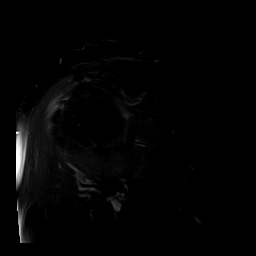
[im 17/17]
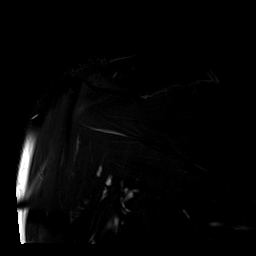

[Series 5: PD fat-sat · oblique · 4.0mm · 0.29mm/px · 8 of 17 slices shown (2 of 2)]
[im 1/17]
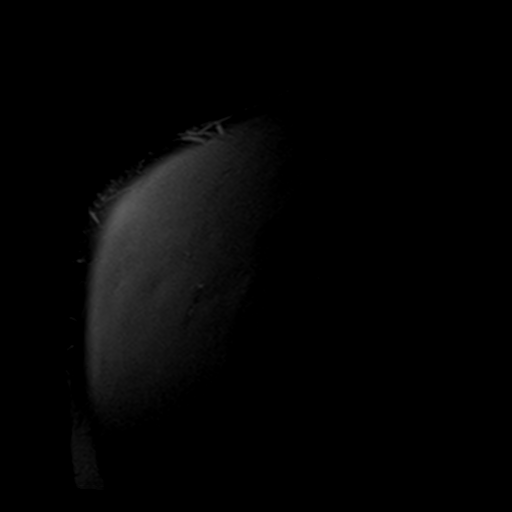
[im 3/17]
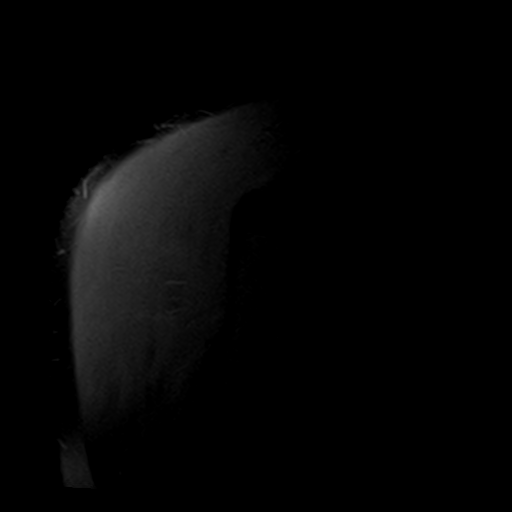
[im 5/17]
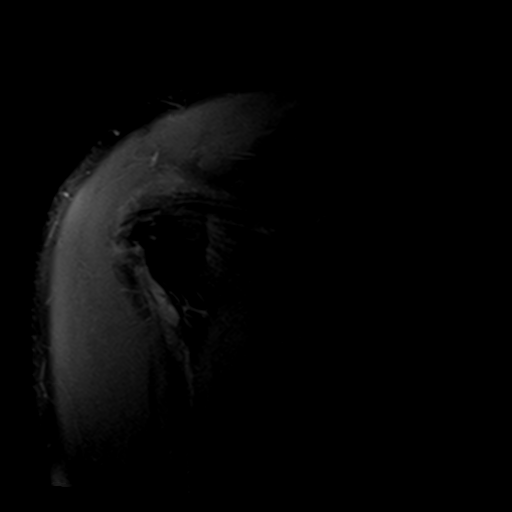
[im 7/17]
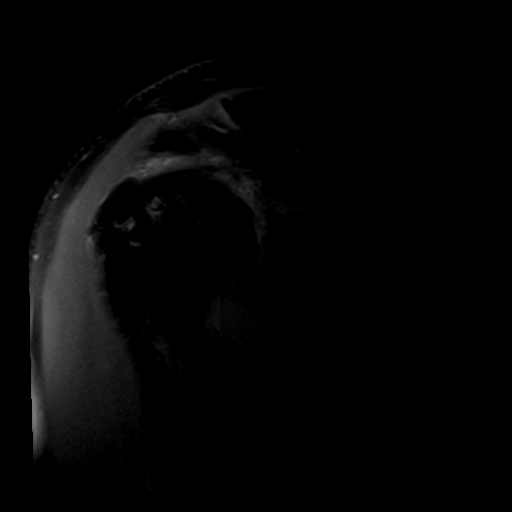
[im 10/17]
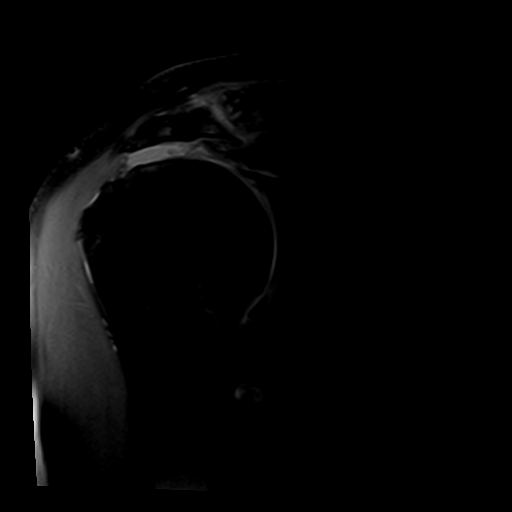
[im 12/17]
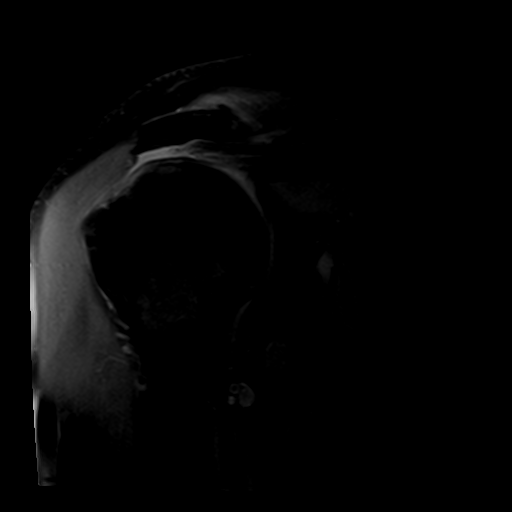
[im 14/17]
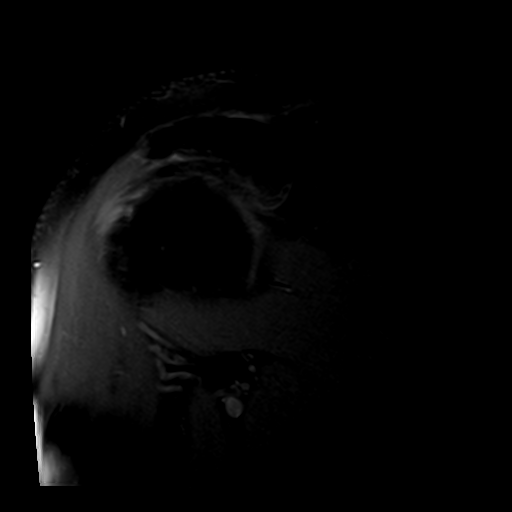
[im 17/17]
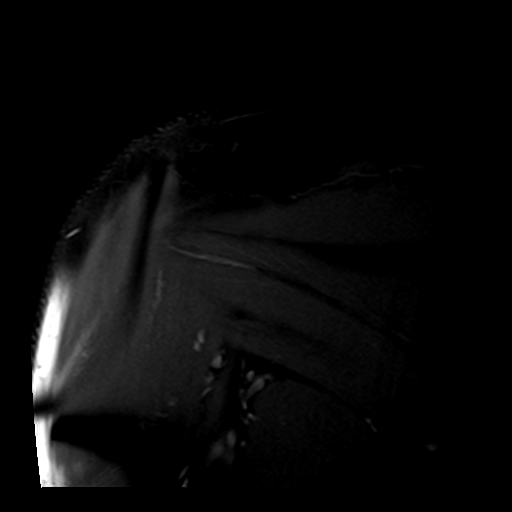

[Series 6: T1 · oblique · 4.0mm · 0.27mm/px · 3 of 10 slices shown]
[im 1/10]
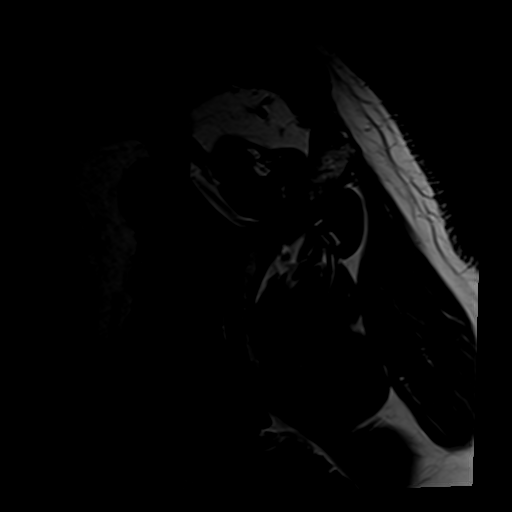
[im 3/10]
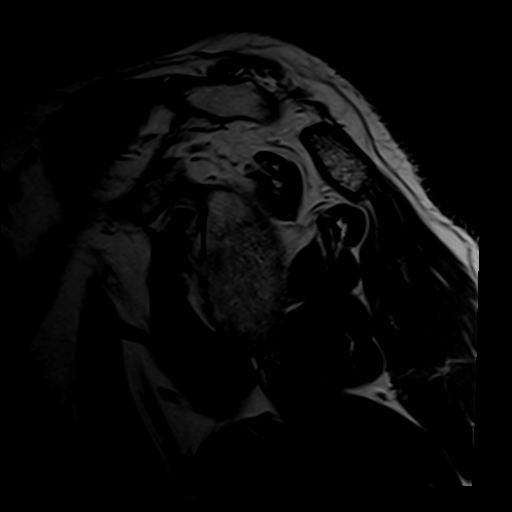
[im 5/10]
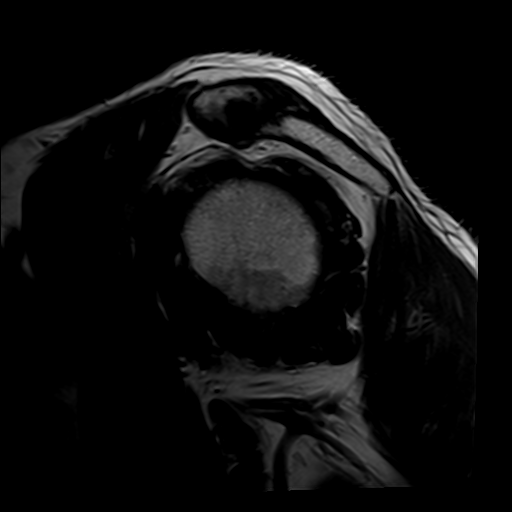

[Series 7: T2 fat-sat · oblique · 4.0mm · 0.55mm/px · 9 of 19 slices shown (2 of 2)]
[im 1/19]
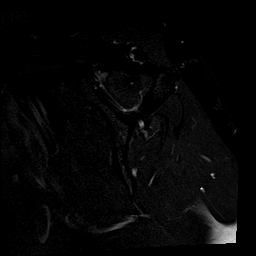
[im 3/19]
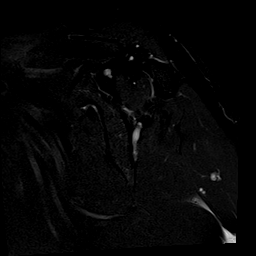
[im 5/19]
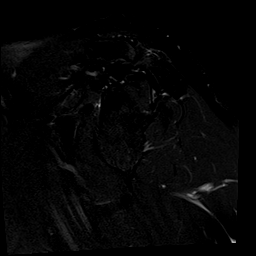
[im 7/19]
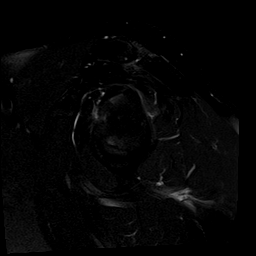
[im 10/19]
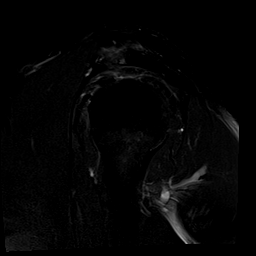
[im 12/19]
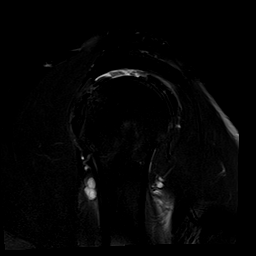
[im 14/19]
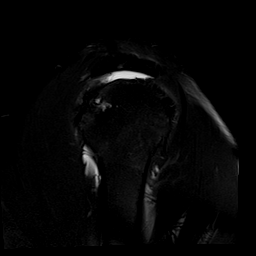
[im 16/19]
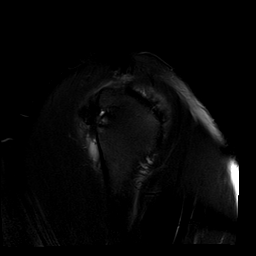
[im 19/19]
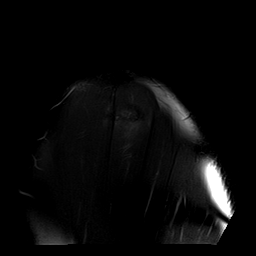

[36 of 40 positions shown; findings below may reference images not displayed]

FINDINGS: Rotator cuff: Full-thickness, full width tear of the supraspinatus
tendon with 3.9 cm retraction to the medial humeral head. Distal
infraspinatus tendinosis with tiny intrasubstance tears. The teres
minor and subscapularis tendons are intact.

Muscles: No atrophy or abnormal signal of the muscles of the rotator
cuff.

Biceps long head: Intact and normally positioned. Small amount of
loculated fluid within the tendon sheath.

Acromioclavicular Joint: Mild arthropathy of the acromioclavicular
joint. Type II acromion. Trace fluid in the subacromial/subdeltoid
bursa.

Glenohumeral Joint: No joint effusion. Scattered partial-thickness
cartilage loss over the humeral head.

Labrum: Grossly intact, but evaluation is limited by lack of
intraarticular fluid.

Bones:  No marrow abnormality, fracture or dislocation.

Other: None.
IMPRESSION: 1. Full-thickness, full width tear of the supraspinatus tendon with
3.9 cm retraction. No atrophy.
2. Distal infraspinatus tendinosis with tiny intrasubstance tears.
3. Biceps tenosynovitis.
4. Mild acromioclavicular and glenohumeral osteoarthritis.

## 2022-04-15 ENCOUNTER — Encounter (HOSPITAL_BASED_OUTPATIENT_CLINIC_OR_DEPARTMENT_OTHER): Payer: Self-pay | Admitting: Otolaryngology

## 2022-04-25 NOTE — H&P (Signed)
HPI:  Philip Peters is a 66 y.o. male who presents as a new patient.  Philip Peters presents today with complaint of recurrent sinus problems. He gives a long history of chronic nasal congestion and intermittent infection. He states he was last treated with antibiotics approximately 2 months ago. He states that he will get temporary relief from antibiotics and steroids. He is currently using Flonase. He has been allergy tested in the past and apparently had multiple positives but he did not receive immunotherapy. He is a former smoker.  PMH/Meds/All/SocHx/FamHx/ROS:  History reviewed. No pertinent past medical history.  History reviewed. No pertinent surgical history.  No family history of bleeding disorders, wound healing problems or difficulty with anesthesia.  Social History  Socioeconomic History  Marital status: Not on file Spouse name: Not on file  Number of children: Not on file  Years of education: Not on file  Highest education level: Not on file Occupational History  Not on file Tobacco Use  Smoking status: Former Types: Cigarettes  Smokeless tobacco: Never Substance and Sexual Activity  Alcohol use: Not Currently  Drug use: Not on file  Sexual activity: Not on file Other Topics Concern  Not on file Social History Narrative  Not on file  Social Determinants of Health  Financial Resource Strain: Not on file Food Insecurity: Not on file Transportation Needs: Not on file Physical Activity: Not on file Stress: Not on file Social Connections: Not on file Housing Stability: Not on file  Current Outpatient Medications:  albuterol 2.5 mg /3 mL (0.083 %) Nebu 3 mL, albuterol 5 mg/mL Nebu 0.5 mL, Inhale into the lungs., Disp: , Rfl:  fluticasone propionate (FLONASE) 50 mcg/actuation nasal spray, USE 1 SPRAY IN EACH NOSTRIL TWICE DAILY IN THE MORNING AND EVENING AFTER 1 TO 2 SPRAYS OF AZELASTINE NASAL SPRAY, Disp: , Rfl:  montelukast (SINGULAIR) 10 mg tablet, Take 1 tablet  (10 mg total) by mouth nightly., Disp: , Rfl:  ONETOUCH ULTRA TEST Strp test strips, USE AS DIRECTED TO CHECK BLOOD SUGARS EVERY DAY IN MORNING BEFORE BREAKFAST, Disp: , Rfl:  rosuvastatin (CRESTOR) 10 MG tablet, , Disp: , Rfl:  A complete ROS was performed with pertinent positives/negatives noted in the HPI. The remainder of the ROS are negative.   Physical Exam:  Temp 97.8 F (36.6 C)  Ht 1.829 m (6')  Wt 99.1 kg (218 lb 6.4 oz)  BMI 29.62 kg/m  Constitutional: Patient appears well-nourished and well-developed. No acute distress.  Head/Face: Facial features are symmetric. Skull is normocephalic. Hair and scalp are normal. Normal temporal artery pulses. TMJ shows no joint deformity swelling or erythema.  Eyes: Pupils are equal, round and reactive to light. Conjunctiva and lids are normal. Normal extraocular mobility. Normal vision by patient report.  Ears: Right: Pinna and external meatus normal, normal ear canal skin and caliber without excessive cerumen or drainage. Tympanic membranes intact without effusion or infection. Hearing normal. Left: Pinna and external meatus normal, normal ear canal skin and caliber without excessive cerumen or drainage. Tympanic membranes intact without effusion or infection. Hearing normal.  Nose/Sinus/Nasopharynx: Septum is normal. Nasal polyps visible bilaterally, left greater than right. Normal inferior turbinates. Normal middle and superior turbinates, sinus ostia patent without obstruction, mass or discharge. Nasopharynx patent.  Oral cavity/Oropharynx: Lips normal, multiple missing teeth some carious teeth, normal oral vestibule. Normal floor of mouth, tongue and oral mucosa, no mucosal lesions, ulcer or mass, normal tongue mobility. Hard and soft palate normal with normal mobility. One plus tonsils,  no erythema or exudate. Base of tongue, retromolar trigone and oral pharynx normal. Normal sensation, mobility and gag.  Neck: No  cervical lymphadenopathy, mass or swelling. Salivary glands normal to palpation without swelling, erythema or mass. Normal facial nerve function. Normal thyroid gland palpation.  Neurological: Alert and oriented to self, place and time. Normal reflexes and motor skills, balance and coordination.  Psychiatric: No unusual anxiety or evidence of depression. Appropriate affect.  Independent Review of Additional Tests or Records: None  Procedures: None  Impression & Plans:  1) nasal polyposis  Continue fluticasone nasal spray daily Prednisone 10 mg 6-day taper Return to clinic 2 weeks-CT scan of sinuses first  Return visit. He has had chronic nasal obstruction for years. He gets temporary relief with prednisone but not long-lasting. He also has asthma but is not aspirin sensitive. He has been to a couple of different allergist in the past who recommended immunotherapy but he has not done that yet. On exam there is bilateral nasal polyps seen in the middle meatus. They are causing significant nasal cavity obstruction. CT scan reveals chronic pansinusitis with complete opacification of all sinuses. This is consistent with chronic and diffuse polyposis.  Recommend consideration for bilateral endoscopic sinus surgery. Recommend get back to his allergist and consider getting on immunotherapy. He may be a Dupixent candidate in the future. Risks and benefits of endoscopic sinus surgery were discussed in detail. All questions were answered. He is ready to schedule.

## 2022-04-26 ENCOUNTER — Ambulatory Visit (HOSPITAL_BASED_OUTPATIENT_CLINIC_OR_DEPARTMENT_OTHER)
Admission: RE | Admit: 2022-04-26 | Discharge: 2022-04-26 | Disposition: A | Discharge status: Home / Self Care | Payer: Medicare HMO | Source: Ambulatory Visit | Attending: Otolaryngology | Admitting: Otolaryngology

## 2022-04-26 ENCOUNTER — Ambulatory Visit (HOSPITAL_BASED_OUTPATIENT_CLINIC_OR_DEPARTMENT_OTHER): Payer: Medicare HMO | Admitting: Anesthesiology

## 2022-04-26 ENCOUNTER — Encounter (HOSPITAL_BASED_OUTPATIENT_CLINIC_OR_DEPARTMENT_OTHER)
Admission: RE | Disposition: A | Discharge status: Home / Self Care | Payer: Self-pay | Source: Ambulatory Visit | Attending: Otolaryngology

## 2022-04-26 ENCOUNTER — Encounter (HOSPITAL_BASED_OUTPATIENT_CLINIC_OR_DEPARTMENT_OTHER): Payer: Self-pay | Admitting: Otolaryngology

## 2022-04-26 DIAGNOSIS — J339 Nasal polyp, unspecified: Secondary | ICD-10-CM

## 2022-04-26 DIAGNOSIS — Z87891 Personal history of nicotine dependence: Secondary | ICD-10-CM | POA: Diagnosis not present

## 2022-04-26 DIAGNOSIS — J309 Allergic rhinitis, unspecified: Secondary | ICD-10-CM | POA: Insufficient documentation

## 2022-04-26 DIAGNOSIS — Z01818 Encounter for other preprocedural examination: Secondary | ICD-10-CM

## 2022-04-26 DIAGNOSIS — J324 Chronic pansinusitis: Secondary | ICD-10-CM | POA: Insufficient documentation

## 2022-04-26 DIAGNOSIS — Z79899 Other long term (current) drug therapy: Secondary | ICD-10-CM | POA: Insufficient documentation

## 2022-04-26 DIAGNOSIS — G4730 Sleep apnea, unspecified: Secondary | ICD-10-CM | POA: Diagnosis not present

## 2022-04-26 HISTORY — PX: NASAL SINUS SURGERY: SHX719

## 2022-04-26 HISTORY — DX: Sleep apnea, unspecified: G47.30

## 2022-04-26 SURGERY — SINUS SURGERY, ENDOSCOPIC
Anesthesia: General | Site: Nose | Laterality: Bilateral

## 2022-04-26 MED ORDER — ROCURONIUM BROMIDE 100 MG/10ML IV SOLN
INTRAVENOUS | Status: DC | PRN
  Administered 2022-04-26: 70 mg via INTRAVENOUS

## 2022-04-26 MED ORDER — DEXAMETHASONE SODIUM PHOSPHATE 10 MG/ML IJ SOLN
INTRAMUSCULAR | Status: AC
  Filled 2022-04-26: qty 1

## 2022-04-26 MED ORDER — PROPOFOL 10 MG/ML IV BOLUS
INTRAVENOUS | Status: AC
  Filled 2022-04-26: qty 20

## 2022-04-26 MED ORDER — ONDANSETRON HCL 4 MG/2ML IJ SOLN: 4.0000 mg | Freq: Once | INTRAMUSCULAR | Status: DC | PRN

## 2022-04-26 MED ORDER — OXYMETAZOLINE HCL 0.05 % NA SOLN
NASAL | Status: AC
  Filled 2022-04-26: qty 30

## 2022-04-26 MED ORDER — BACITRACIN ZINC 500 UNIT/GM EX OINT
TOPICAL_OINTMENT | CUTANEOUS | Status: AC
  Filled 2022-04-26: qty 56.7

## 2022-04-26 MED ORDER — ONDANSETRON HCL 4 MG/2ML IJ SOLN
INTRAMUSCULAR | Status: AC
  Filled 2022-04-26: qty 2

## 2022-04-26 MED ORDER — DEXAMETHASONE SODIUM PHOSPHATE 4 MG/ML IJ SOLN
INTRAMUSCULAR | Status: DC | PRN
  Administered 2022-04-26: 10 mg via INTRAVENOUS

## 2022-04-26 MED ORDER — SUGAMMADEX SODIUM 500 MG/5ML IV SOLN
INTRAVENOUS | Status: AC
  Filled 2022-04-26: qty 5

## 2022-04-26 MED ORDER — LIDOCAINE-EPINEPHRINE 1 %-1:100000 IJ SOLN
INTRAMUSCULAR | Status: DC | PRN
  Administered 2022-04-26: 9 mL

## 2022-04-26 MED ORDER — FENTANYL CITRATE (PF) 100 MCG/2ML IJ SOLN
INTRAMUSCULAR | Status: DC | PRN
  Administered 2022-04-26: 100 ug via INTRAVENOUS

## 2022-04-26 MED ORDER — LIDOCAINE-EPINEPHRINE 1 %-1:100000 IJ SOLN
INTRAMUSCULAR | Status: AC
  Filled 2022-04-26: qty 2

## 2022-04-26 MED ORDER — LIDOCAINE HCL (CARDIAC) PF 100 MG/5ML IV SOSY
PREFILLED_SYRINGE | INTRAVENOUS | Status: DC | PRN
  Administered 2022-04-26: 100 mg via INTRAVENOUS

## 2022-04-26 MED ORDER — HYDROMORPHONE HCL 1 MG/ML IJ SOLN
INTRAMUSCULAR | Status: AC
  Filled 2022-04-26: qty 0.5

## 2022-04-26 MED ORDER — PROPOFOL 10 MG/ML IV BOLUS
INTRAVENOUS | Status: DC | PRN
  Administered 2022-04-26: 150 mg via INTRAVENOUS

## 2022-04-26 MED ORDER — ACETAMINOPHEN 500 MG PO TABS
ORAL_TABLET | ORAL | Status: AC
  Filled 2022-04-26: qty 2

## 2022-04-26 MED ORDER — HYDRALAZINE HCL 20 MG/ML IJ SOLN
5.0000 mg | Freq: Once | INTRAMUSCULAR | Status: AC
  Administered 2022-04-26: 5 mg via INTRAVENOUS

## 2022-04-26 MED ORDER — HYDRALAZINE HCL 20 MG/ML IJ SOLN
INTRAMUSCULAR | Status: AC
  Filled 2022-04-26: qty 1

## 2022-04-26 MED ORDER — HYDROMORPHONE HCL 1 MG/ML IJ SOLN
0.2500 mg | INTRAMUSCULAR | Status: DC | PRN
  Administered 2022-04-26 (×2): 0.5 mg via INTRAVENOUS

## 2022-04-26 MED ORDER — OXYCODONE HCL 5 MG/5ML PO SOLN: 5.0000 mg | Freq: Once | ORAL | Status: DC | PRN

## 2022-04-26 MED ORDER — OXYMETAZOLINE HCL 0.05 % NA SOLN
NASAL | Status: DC | PRN
  Administered 2022-04-26: 1 via TOPICAL

## 2022-04-26 MED ORDER — LIDOCAINE 2% (20 MG/ML) 5 ML SYRINGE
INTRAMUSCULAR | Status: AC
  Filled 2022-04-26: qty 5

## 2022-04-26 MED ORDER — GLYCOPYRROLATE 0.2 MG/ML IJ SOLN
INTRAMUSCULAR | Status: DC | PRN
  Administered 2022-04-26: .2 mg via INTRAVENOUS

## 2022-04-26 MED ORDER — LACTATED RINGERS IV SOLN: INTRAVENOUS | Status: DC

## 2022-04-26 MED ORDER — ROCURONIUM BROMIDE 10 MG/ML (PF) SYRINGE
PREFILLED_SYRINGE | INTRAVENOUS | Status: AC
  Filled 2022-04-26: qty 10

## 2022-04-26 MED ORDER — HYDROCODONE-ACETAMINOPHEN 7.5-325 MG PO TABS: 1.0000 | ORAL_TABLET | Freq: Four times a day (QID) | ORAL | 0 refills | Status: DC | PRN

## 2022-04-26 MED ORDER — OXYMETAZOLINE HCL 0.05 % NA SOLN
2.0000 | NASAL | Status: DC
  Administered 2022-04-26: 2 via NASAL

## 2022-04-26 MED ORDER — ONDANSETRON 4 MG PO TBDP: 4.0000 mg | ORAL_TABLET | Freq: Three times a day (TID) | ORAL | 0 refills | Status: DC | PRN

## 2022-04-26 MED ORDER — OXYMETAZOLINE HCL 0.05 % NA SOLN
NASAL | Status: AC
  Filled 2022-04-26: qty 60

## 2022-04-26 MED ORDER — SUGAMMADEX SODIUM 500 MG/5ML IV SOLN
INTRAVENOUS | Status: DC | PRN
  Administered 2022-04-26: 400 mg via INTRAVENOUS

## 2022-04-26 MED ORDER — OXYCODONE HCL 5 MG PO TABS: 5.0000 mg | ORAL_TABLET | Freq: Once | ORAL | Status: DC | PRN

## 2022-04-26 MED ORDER — FENTANYL CITRATE (PF) 100 MCG/2ML IJ SOLN
INTRAMUSCULAR | Status: AC
  Filled 2022-04-26: qty 2

## 2022-04-26 MED ORDER — SODIUM CHLORIDE 0.9 % IR SOLN
Status: DC | PRN
  Administered 2022-04-26: 200 mL

## 2022-04-26 MED ORDER — ONDANSETRON HCL 4 MG/2ML IJ SOLN
INTRAMUSCULAR | Status: DC | PRN
  Administered 2022-04-26: 4 mg via INTRAVENOUS

## 2022-04-26 SURGICAL SUPPLY — 41 items
ATTRACTOMAT 16X20 MAGNETIC DRP (DRAPES) IMPLANT
BLADE RAD40 ROTATE 4M 4 5PK (BLADE) IMPLANT
BLADE RAD60 ROTATE M4 4 5PK (BLADE) IMPLANT
BLADE TRICUT ROTATE M4 4 5PK (BLADE) IMPLANT
BUR HS RAD FRONTAL 3 (BURR) IMPLANT
CANISTER SUC SOCK COL 7IN (MISCELLANEOUS) ×1 IMPLANT
CANISTER SUCT 1200ML W/VALVE (MISCELLANEOUS) ×2 IMPLANT
CORD BIPOLAR FORCEPS 12FT (ELECTRODE) IMPLANT
DEFOGGER MIRROR 1QT (MISCELLANEOUS) ×1 IMPLANT
DRESSING NASAL KENNEDY 3.5X.9 (MISCELLANEOUS) IMPLANT
DRSG CURAD 3X16 NADH (PACKING) IMPLANT
DRSG NASAL KENNEDY 3.5X.9 (MISCELLANEOUS)
DRSG NASAL KENNEDY LMNT 8CM (GAUZE/BANDAGES/DRESSINGS) IMPLANT
DRSG NASOPORE 8CM (GAUZE/BANDAGES/DRESSINGS) ×1 IMPLANT
DRSG TELFA 3X8 NADH STRL (GAUZE/BANDAGES/DRESSINGS) IMPLANT
FORCEPS BIPOLAR SPETZLER 8 1.0 (NEUROSURGERY SUPPLIES) IMPLANT
GAUZE 4X4 16PLY ~~LOC~~+RFID DBL (SPONGE) IMPLANT
GAUZE VASELINE FOILPK 1/2 X 72 (GAUZE/BANDAGES/DRESSINGS) IMPLANT
GLOVE BIOGEL PI IND STRL 7.0 (GLOVE) IMPLANT
GLOVE ECLIPSE 7.5 STRL STRAW (GLOVE) ×1 IMPLANT
GLOVE SURG SS PI 6.5 STRL IVOR (GLOVE) IMPLANT
GOWN STRL REUS W/ TWL LRG LVL3 (GOWN DISPOSABLE) ×2 IMPLANT
GOWN STRL REUS W/TWL LRG LVL3 (GOWN DISPOSABLE) ×2
HEMOSTAT SURGICEL .5X2 ABSORB (HEMOSTASIS) IMPLANT
IV NS 500ML (IV SOLUTION) ×1
IV NS 500ML BAXH (IV SOLUTION) IMPLANT
NDL HYPO 27GX1-1/4 (NEEDLE) ×1 IMPLANT
NDL SPNL 25GX3.5 QUINCKE BL (NEEDLE) IMPLANT
NEEDLE HYPO 27GX1-1/4 (NEEDLE) ×1 IMPLANT
NEEDLE SPNL 25GX3.5 QUINCKE BL (NEEDLE) IMPLANT
NS IRRIG 1000ML POUR BTL (IV SOLUTION) ×1 IMPLANT
PACK BASIN DAY SURGERY FS (CUSTOM PROCEDURE TRAY) ×1 IMPLANT
PACK ENT DAY SURGERY (CUSTOM PROCEDURE TRAY) ×1 IMPLANT
PATTIES SURGICAL .5 X3 (DISPOSABLE) ×1 IMPLANT
SLEEVE SCD COMPRESS KNEE MED (STOCKING) ×1 IMPLANT
SPIKE FLUID TRANSFER (MISCELLANEOUS) IMPLANT
SPONGE GAUZE 2X2 8PLY STRL LF (GAUZE/BANDAGES/DRESSINGS) ×1 IMPLANT
SPONGE SURGIFOAM ABS GEL 12-7 (HEMOSTASIS) IMPLANT
TOWEL GREEN STERILE FF (TOWEL DISPOSABLE) ×1 IMPLANT
TUBE CONNECTING 20X1/4 (TUBING) IMPLANT
YANKAUER SUCT BULB TIP NO VENT (SUCTIONS) ×1 IMPLANT

## 2022-04-26 NOTE — Op Note (Signed)
OPERATIVE REPORT  DATE OF SURGERY: 04/26/2022  PATIENT:  Philip Peters,  66 y.o. male  PRE-OPERATIVE DIAGNOSIS:  Nasal polyposis; Chronic pansinusitis; Allergic rhinitis  POST-OPERATIVE DIAGNOSIS:  Nasal polyposis; Chronic pansinusitis; Allergic rhinitis  PROCEDURE:  Procedure(s): ENDOSCOPIC NASAL POLYPECTOMY, FRONTAL ETHMOID, MAXILLARY  SINUS SURGERY, bilateral  SURGEON:  Susy Frizzle, MD  ASSISTANTS: None  ANESTHESIA:   General   EBL: 400 ml  DRAINS: None  LOCAL MEDICATIONS USED: 1% Xylocaine with epinephrine  SPECIMEN: Bilateral nasal and sinus contents  COUNTS:  Correct  PROCEDURE DETAILS: The patient was taken to the operating room and placed on the operating table in the supine position. Following induction of general endotracheal anesthesia, the face was draped in a standard fashion.  Oxymetazoline spray was used preoperatively in the nasal cavities.  1.  Bilateral endoscopic nasal polypectomy.  Using 0 degree nasal endoscope the nasal cavities were inspected bilaterally and both were completely filled with polypoid tissue.  This was debrided back using the microdebrider using a straight blade.  This was followed all the way back into the infundibulum and in the superior meatus and back towards the nasopharynx bilaterally.  All polypoid tissue was collected and sent with other specimen for pathologic evaluation.  Polypoid disease was debrided back towards the sphenoethmoidal recess bilaterally.  Sphenoid sinuses were not entered.  2.  Bilateral endoscopic total ethmoidectomy.  Using combination of 0 and 30 degree nasal endoscopes and microdebrider a complete ethmoid dissection was accomplished on the way laterally to the lamina papyracea which was kept intact bilaterally.  The fovea was intact superiorly.  There is extensive polypoid disease throughout all ethmoid cells.  The ground lamella was taken down exposing the posterior cells.  The middle turbinate was partially  resected from both sides.  3.  Bilateral frontal sinusotomy, endoscopic.  After the ethmoid dissection was completed with curved suction to 30 degree endoscope was used to clear out the frontal recess up into the frontal sinus bilaterally.  Again there is extensive polypoid disease in the frontal recess and duct on both sides and thick inspissated mucus contained within the frontal sinus on both sides.  4.  Bilateral endoscopic maxillary antrostomy.  A curved scope and 30 degree endoscope was used to palpate the fontanelle on both sides and the maxillary Antrum was entered using the curved suction.  There was thick inspissated mucosal secretions in the antrum bilaterally and extensive polypoid disease at the antrostomy site.  This was all debrided back and a backbiting forcep was used to enlarge the antrostomy anteriorly on both sides.  At the end the procedure half of a nasal pore packing was placed into each ethmoid cavity.  Nasopharynx and oropharynx were suctioned of blood and secretions.  Patient was awakened extubated and transferred to recovery in stable condition.  PATIENT DISPOSITION:  To PACU, stable

## 2022-04-26 NOTE — Transfer of Care (Signed)
Immediate Anesthesia Transfer of Care Note  Patient: Philip Peters  Procedure(s) Performed: ENDOSCOPIC NASAL POLYPECTOMY, FRONTAL ETHMOID, MAXILLARY AND SPHENOID SINUS SURGERY (Bilateral: Nose)  Patient Location: PACU  Anesthesia Type:General  Level of Consciousness: awake, alert , and patient cooperative  Airway & Oxygen Therapy: Patient Spontanous Breathing and Patient connected to face mask oxygen  Post-op Assessment: Report given to RN and Post -op Vital signs reviewed and stable  Post vital signs: Reviewed and stable  Last Vitals:  Vitals Value Taken Time  BP    Temp    Pulse    Resp    SpO2      Last Pain:  Vitals:   04/26/22 0657  TempSrc: Oral  PainSc: 0-No pain      Patients Stated Pain Goal: 0 (04/26/22 0657)  Complications: No notable events documented.

## 2022-04-26 NOTE — Discharge Instructions (Addendum)
  Post Anesthesia Home Care Instructions  Activity: Get plenty of rest for the remainder of the day. A responsible individual must stay with you for 24 hours following the procedure.  For the next 24 hours, DO NOT: -Drive a car -Advertising copywriter -Drink alcoholic beverages -Take any medication unless instructed by your physician -Make any legal decisions or sign important papers.  Meals: Start with liquid foods such as gelatin or soup. Progress to regular foods as tolerated. Avoid greasy, spicy, heavy foods. If nausea and/or vomiting occur, drink only clear liquids until the nausea and/or vomiting subsides. Call your physician if vomiting continues.  Special Instructions/Symptoms: Your throat may feel dry or sore from the anesthesia or the breathing tube placed in your throat during surgery. If this causes discomfort, gargle with warm salt water. The discomfort should disappear within 24 hours.  If you had a scopolamine patch placed behind your ear for the management of post- operative nausea and/or vomiting:  1. The medication in the patch is effective for 72 hours, after which it should be removed.  Wrap patch in a tissue and discard in the trash. Wash hands thoroughly with soap and water. 2. You may remove the patch earlier than 72 hours if you experience unpleasant side effects which may include dry mouth, dizziness or visual disturbances. 3. Avoid touching the patch. Wash your hands with soap and water after contact with the patch.      Use nasal saline spray every hour while awake.  Resume nasal irrigations in 1 week.

## 2022-04-26 NOTE — Anesthesia Procedure Notes (Signed)
Procedure Name: Intubation Date/Time: 04/26/2022 7:48 AM  Performed by: Verita Lamb, CRNAPre-anesthesia Checklist: Patient identified, Emergency Drugs available, Suction available and Patient being monitored Patient Re-evaluated:Patient Re-evaluated prior to induction Oxygen Delivery Method: Circle system utilized Preoxygenation: Pre-oxygenation with 100% oxygen Induction Type: IV induction Ventilation: Mask ventilation without difficulty Laryngoscope Size: Mac and 4 Grade View: Grade I Tube type: Oral Tube size: 7.5 mm Number of attempts: 1 Airway Equipment and Method: Stylet and Oral airway Placement Confirmation: ETT inserted through vocal cords under direct vision, positive ETCO2, breath sounds checked- equal and bilateral and CO2 detector Secured at: 25 cm Tube secured with: Tape Dental Injury: Teeth and Oropharynx as per pre-operative assessment  Comments: Poor dentition noted preop with multiple missing and broken teeth.  Teeth unchanged after intubation

## 2022-04-26 NOTE — Interval H&P Note (Signed)
History and Physical Interval Note:  04/26/2022 7:19 AM  Philip Peters  has presented today for surgery, with the diagnosis of Nasal polyposis; Chronic pansinusitis; Allergic rhinitis.  The various methods of treatment have been discussed with the patient and family. After consideration of risks, benefits and other options for treatment, the patient has consented to  Procedure(s): ENDOSCOPIC POLYPECTOMY, FRONTAL ETHMOID, MAXILLARY AND SPHENOID SINUS SURGERY (Bilateral) as a surgical intervention.  The patient's history has been reviewed, patient examined, no change in status, stable for surgery.  I have reviewed the patient's chart and labs.  Questions were answered to the patient's satisfaction.     Serena Colonel

## 2022-04-26 NOTE — Anesthesia Postprocedure Evaluation (Signed)
Anesthesia Post Note  Patient: Philip Peters  Procedure(s) Performed: ENDOSCOPIC NASAL POLYPECTOMY, FRONTAL ETHMOID, MAXILLARY AND SPHENOID SINUS SURGERY (Bilateral: Nose)     Patient location during evaluation: PACU Anesthesia Type: General Level of consciousness: awake and alert Pain management: pain level controlled Vital Signs Assessment: post-procedure vital signs reviewed and stable Respiratory status: spontaneous breathing, nonlabored ventilation and respiratory function stable Cardiovascular status: blood pressure returned to baseline and stable Postop Assessment: no apparent nausea or vomiting Anesthetic complications: no  No notable events documented.  Last Vitals:  Vitals:   04/26/22 0915 04/26/22 0930  BP: (!) 150/105 (!) 153/98  Pulse: 80 82  Resp: 14 13  Temp:    SpO2: 98% 95%    Last Pain:  Vitals:   04/26/22 0930  TempSrc:   PainSc: 4                  Chase Arnall,W. EDMOND

## 2022-04-26 NOTE — Anesthesia Preprocedure Evaluation (Addendum)
Anesthesia Evaluation  Patient identified by MRN, date of birth, ID band Patient awake    Reviewed: Allergy & Precautions, H&P , NPO status , Patient's Chart, lab work & pertinent test results  Airway Mallampati: II  TM Distance: >3 FB Neck ROM: Full    Dental no notable dental hx. (+) Edentulous Upper, Partial Lower, Poor Dentition, Dental Advisory Given   Pulmonary asthma , sleep apnea , former smoker   Pulmonary exam normal breath sounds clear to auscultation       Cardiovascular negative cardio ROS  Rhythm:Regular Rate:Normal     Neuro/Psych negative neurological ROS  negative psych ROS   GI/Hepatic negative GI ROS, Neg liver ROS,,,  Endo/Other  negative endocrine ROS    Renal/GU negative Renal ROS  negative genitourinary   Musculoskeletal  (+) Arthritis ,    Abdominal   Peds  Hematology negative hematology ROS (+)   Anesthesia Other Findings   Reproductive/Obstetrics negative OB ROS                             Anesthesia Physical Anesthesia Plan  ASA: 3  Anesthesia Plan: General   Post-op Pain Management: Tylenol PO (pre-op)*   Induction: Intravenous  PONV Risk Score and Plan: 3 and Ondansetron, Dexamethasone and Midazolam  Airway Management Planned: Oral ETT  Additional Equipment:   Intra-op Plan:   Post-operative Plan: Extubation in OR  Informed Consent: I have reviewed the patients History and Physical, chart, labs and discussed the procedure including the risks, benefits and alternatives for the proposed anesthesia with the patient or authorized representative who has indicated his/her understanding and acceptance.     Dental advisory given  Plan Discussed with: CRNA  Anesthesia Plan Comments:        Anesthesia Quick Evaluation

## 2022-04-27 ENCOUNTER — Encounter (HOSPITAL_BASED_OUTPATIENT_CLINIC_OR_DEPARTMENT_OTHER): Payer: Self-pay | Admitting: Otolaryngology

## 2022-04-27 LAB — SURGICAL PATHOLOGY

## 2022-04-27 NOTE — Progress Notes (Signed)
Phone number not working.

## 2023-07-02 ENCOUNTER — Ambulatory Visit (HOSPITAL_COMMUNITY)
Admission: EM | Admit: 2023-07-02 | Discharge: 2023-07-02 | Disposition: A | Discharge status: Home / Self Care | Payer: Medicare Other

## 2023-07-02 ENCOUNTER — Encounter (HOSPITAL_COMMUNITY): Payer: Self-pay

## 2023-07-02 DIAGNOSIS — J069 Acute upper respiratory infection, unspecified: Secondary | ICD-10-CM

## 2023-07-02 DIAGNOSIS — R051 Acute cough: Secondary | ICD-10-CM | POA: Diagnosis not present

## 2023-07-02 MED ORDER — PREDNISONE 20 MG PO TABS: 40.0000 mg | ORAL_TABLET | Freq: Every day | ORAL | 0 refills | Status: AC

## 2023-07-02 MED ORDER — AZITHROMYCIN 250 MG PO TABS: ORAL_TABLET | ORAL | 0 refills | Status: DC

## 2023-07-02 MED ORDER — PROMETHAZINE-DM 6.25-15 MG/5ML PO SYRP: 5.0000 mL | ORAL_SOLUTION | Freq: Every evening | ORAL | 0 refills | Status: DC | PRN

## 2023-07-02 NOTE — ED Triage Notes (Signed)
Patient having congestion, chest discomfort, headache, cough, and runny nose onset 1 week ago. No known sick exposure.   Patient tried otc cough syrup for diabetic with slight relief.

## 2023-07-02 NOTE — Discharge Instructions (Addendum)
Start taking azithromycin by taking 2 tablets today and 1 tablet on the remaining 4 days.  Take prednisone once daily for 5 days.  I have also prescribed promethazine DM cough syrup that you can take at night for cough.  This can make you drowsy so do not take while driving.  Otherwise alternate between Tylenol and ibuprofen as needed for pain and fever.  I also recommend taking Mucinex to help with cough and congestion.  Return here if symptoms persist or worsen.

## 2023-07-02 NOTE — ED Provider Notes (Signed)
[facility]    CSN: [id number] Arrival date & time: 07/02/23  1007      History   Chief Complaint Chief Complaint  Patient presents with   Cough   Headache    HPI Philip Peters is a 68 y.o. male.   Patient presents with cough, congestion, chest congestion, headache, and runny nose x 1 week.  Patient reports coughing up yellow/green sputum.  Patient reports mild wheezing at night while laying in bed.  Reports taking over-the-counter cough syrup with minimal relief.  Denies shortness of breath, chest pain, and fever.   Cough Associated symptoms: headaches, rhinorrhea and wheezing   Associated symptoms: no chest pain, no chills, no fever, no shortness of breath and no sore throat   Headache Associated symptoms: congestion and cough   Associated symptoms: no fatigue, no fever, no sinus pressure, no sore throat and no weakness     Past Medical History:  Diagnosis Date   Allergy    Arthritis    Asthma    Sleep apnea    cpap on the way    Patient Active Problem List   Diagnosis Date Noted   Encounter for screening and preventative care 03/15/2016   Spinal stenosis, lumbar region, with neurogenic claudication 11/11/2015    Class: Chronic   Degenerative disc disease, lumbar 11/11/2015    Class: Chronic   Spondylolisthesis of lumbar region 11/11/2015    Class: Chronic   Lumbar degenerative disc disease 11/11/2015   Asthma, moderate persistent 08/01/2014    Past Surgical History:  Procedure Laterality Date   FOREIGN BODY REMOVAL Right 05/22/2014   Procedure: FOREIGN BODY REMOVAL ADULT RIGHT INDEX;  Surgeon: Cindee Salt, MD;  Location: [facility];  Service: Orthopedics;  Laterality: Right;   left shoulder surgery     LUMBAR FUSION N/A 11/11/2015   Procedure: TRANSFORAMINAL LUMBAR INTERBODY FUSIONS L4-5 AND L5-S1 WITH CAGES, PEDICLE SCREWS AND RODS;  Surgeon: Kerrin Champagne, MD;  Location: [facility];  Service: Orthopedics;  Laterality: N/A;   NASAL  SINUS SURGERY Bilateral 04/26/2022   Procedure: ENDOSCOPIC NASAL POLYPECTOMY, FRONTAL ETHMOID, MAXILLARY AND SPHENOID SINUS SURGERY;  Surgeon: Serena Colonel, MD;  Location: [facility];  Service: ENT;  Laterality: Bilateral;       Home Medications    Prior to Admission medications   Medication Sig Start Date End Date Taking? Authorizing Provider  albuterol (PROVENTIL) (2.5 MG/3ML) 0.083% nebulizer solution VVN Q 6 H PRF WHZ OR SOB Inhalation   Yes [provider]  azithromycin (ZITHROMAX Z-PAK) 250 MG tablet Take 2 pills (500mg ) first day and one pill (250mg ) the remaining 4 days. 07/02/23  Yes Susann Givens, Dereonna Lensing A, NP  cetirizine-pseudoephedrine (ZYRTEC-D) 5-120 MG tablet Take 1 tablet by mouth 2 (two) times daily. 11/29/17  Yes Wieters, Hallie C, PA-C  EPINEPHrine 0.3 mg/0.3 mL IJ SOAJ injection INJECT THE CONTENTS OF 1 SYRINGE INTO THE MUSCLE ONCE AS NEEDED FOR ANAPHYLAXIS 06/01/22  Yes [provider]  meloxicam (MOBIC) 15 MG tablet Take 15 mg by mouth daily. 11/20/19  Yes [provider]  montelukast (SINGULAIR) 10 MG tablet Take 10 mg by mouth daily. 10/20/19  Yes [provider]  predniSONE (DELTASONE) 20 MG tablet Take 2 tablets (40 mg total) by mouth daily for 5 days. 07/02/23 07/07/23 Yes Amario Longmore A, NP  promethazine-dextromethorphan (PROMETHAZINE-DM) 6.25-15 MG/5ML syrup Take 5 mLs by mouth at bedtime as needed for cough. 07/02/23  Yes Wynonia Lawman A, NP  rosuvastatin (  CRESTOR) 10 MG tablet  10/24/21  Yes [provider]  sildenafil (VIAGRA) 50 MG tablet  09/19/19  Yes [provider]  tamsulosin (FLOMAX) 0.4 MG CAPS capsule Take 0.4 mg by mouth daily. 11/20/19  Yes [provider]    Family History Family History  Problem Relation Age of Onset   Diabetes Father    Cancer Sister    Colon cancer Neg Hx    Colon polyps Neg Hx     Social History Social History   Tobacco Use   Smoking status: Former     Current packs/day: 0.00    Average packs/day: 2.0 packs/day for 35.0 years (70.0 ttl pk-yrs)    Types: Cigarettes    Start date: 01/26/1969    Quit date: 01/27/2004    Years since quitting: 19.4   Smokeless tobacco: Never  Substance Use Topics   Alcohol use: Not Currently   Drug use: No     Allergies   Patient has no known allergies.   Review of Systems Review of Systems  Constitutional:  Negative for chills, fatigue and fever.  HENT:  Positive for congestion and rhinorrhea. Negative for sinus pressure, sinus pain and sore throat.   Respiratory:  Positive for cough and wheezing. Negative for chest tightness and shortness of breath.   Cardiovascular:  Negative for chest pain.  Neurological:  Positive for headaches. Negative for weakness.     Physical Exam Triage Vital Signs ED Triage Vitals  Encounter Vitals Group     BP 07/02/23 1044 123/76     Systolic BP Percentile --      Diastolic BP Percentile --      Pulse Rate 07/02/23 1044 87     Resp 07/02/23 1044 16     Temp 07/02/23 1044 98 F (36.7 C)     Temp Source 07/02/23 1044 Oral     SpO2 07/02/23 1044 96 %     Weight 07/02/23 1044 219 lb (99.3 kg)     Height 07/02/23 1044 6' (1.829 m)     Head Circumference --      Peak Flow --      Pain Score 07/02/23 1041 7     Pain Loc --      Pain Education --      Exclude from Growth Chart --    No data found.  Updated Vital Signs BP 123/76 (BP Location: Right Arm)   Pulse 87   Temp 98 F (36.7 C) (Oral)   Resp 16   Ht 6' (1.829 m)   Wt 219 lb (99.3 kg)   SpO2 96%   BMI 29.70 kg/m   Visual Acuity Right Eye Distance:   Left Eye Distance:   Bilateral Distance:    Right Eye Near:   Left Eye Near:    Bilateral Near:     Physical Exam Vitals and nursing note reviewed.  Constitutional:      General: He is awake. He is not in acute distress.    Appearance: Normal appearance. He is well-developed and well-groomed. He is not ill-appearing.  HENT:     Right  Ear: Tympanic membrane, ear canal and external ear normal.     Left Ear: Tympanic membrane, ear canal and external ear normal.     Nose: Congestion and rhinorrhea present.     Mouth/Throat:     Mouth: Mucous membranes are moist.     Pharynx: Posterior oropharyngeal erythema present. No oropharyngeal exudate.  Cardiovascular:  Rate and Rhythm: Normal rate and regular rhythm.  Pulmonary:     Effort: Pulmonary effort is normal.     Breath sounds: Normal breath sounds.  Musculoskeletal:        General: Normal range of motion.  Skin:    General: Skin is warm and dry.  Neurological:     Mental Status: He is alert.  Psychiatric:        Behavior: Behavior is cooperative.      UC Treatments / Results  Labs (all labs ordered are listed, but only abnormal results are displayed) Labs Reviewed - No data to display  EKG   Radiology No results found.  Procedures Procedures (including critical care time)  Medications Ordered in UC Medications - No data to display  Initial Impression / Assessment and Plan / UC Course  I have reviewed the triage vital signs and the nursing notes.  Pertinent labs & imaging results that were available during my care of the patient were reviewed by me and considered in my medical decision making (see chart for details).     Patient presented with productive cough with yellow/green sputum, congestion, chest congestion, headache, and runny nose x 1 week.  Reports some mild wheezing at night while laying in bed.  Denies any other symptoms.  Upon assessment congestion and rhinorrhea are present, mild erythema noted to pharynx.  Lungs clear bilaterally on auscultation.  Prescribed azithromycin and prednisone for persistent upper respiratory infection.  Prescribed Promethazine DM cough syrup for cough at night.  Discussed return precautions. Final Clinical Impressions(s) / UC Diagnoses   Final diagnoses:  Acute upper respiratory infection  Acute cough      Discharge Instructions      Start taking azithromycin by taking 2 tablets today and 1 tablet on the remaining 4 days.  Take prednisone once daily for 5 days.  I have also prescribed promethazine DM cough syrup that you can take at night for cough.  This can make you drowsy so do not take while driving.  Otherwise alternate between Tylenol and ibuprofen as needed for pain and fever.  I also recommend taking Mucinex to help with cough and congestion.  Return here if symptoms persist or worsen.    ED Prescriptions     Medication Sig Dispense Auth. Provider   azithromycin (ZITHROMAX Z-PAK) 250 MG tablet Take 2 pills (500mg ) first day and one pill (250mg ) the remaining 4 days. 6 tablet Wynonia Lawman A, NP   predniSONE (DELTASONE) 20 MG tablet Take 2 tablets (40 mg total) by mouth daily for 5 days. 10 tablet Wynonia Lawman A, NP   promethazine-dextromethorphan (PROMETHAZINE-DM) 6.25-15 MG/5ML syrup Take 5 mLs by mouth at bedtime as needed for cough. 118 mL Wynonia Lawman A, NP      PDMP not reviewed this encounter.   Wynonia Lawman A, NP 07/02/23 1109

## 2023-08-18 ENCOUNTER — Encounter: Payer: Self-pay | Admitting: Internal Medicine

## 2023-08-18 ENCOUNTER — Ambulatory Visit

## 2023-08-18 ENCOUNTER — Ambulatory Visit: Payer: Medicare HMO | Admitting: Internal Medicine

## 2023-08-18 VITALS — BP 118/72 | HR 86 | Temp 97.6°F | Ht 72.0 in | Wt 228.2 lb

## 2023-08-18 DIAGNOSIS — J4489 Other specified chronic obstructive pulmonary disease: Secondary | ICD-10-CM | POA: Insufficient documentation

## 2023-08-18 NOTE — Progress Notes (Unsigned)
 Philip Peters, male    DOB: August 18, 1955   MRN: [id number]   Brief patient profile:  68 yobm  quit smoking in 2005  s apparent residual symptoms  referred to pulmonary clinic 08/18/2023 by Fatima Sanger NP  for  copd  eval   07/03/2018 CT chest no emphysema   04/2022 eval/ rx by Pollyann Kennedy for sinusitis/ ? 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    Also denies any obvious fluctuation of symptoms with weather or environmental changes or other aggravating or alleviating factors except as outlined above   No unusual exposure hx or h/o childhood pna/ asthma or knowledge of premature birth.  Current Allergies, Complete Past Medical History, Past Surgical History, Family History, and Social History were reviewed in [employer] record.  ROS  The following are not active complaints unless bolded Hoarseness, sore throat, dysphagia, dental problems, itching, sneezing,  nasal congestion or discharge of excess mucus or purulent secretions, ear ache,   fever, chills, sweats, unintended wt loss or wt gain, classically pleuritic or exertional cp,  orthopnea pnd or arm/hand swelling  or leg swelling, presyncope, palpitations, abdominal pain, anorexia, nausea, vomiting, diarrhea  or change in bowel habits or change in bladder habits, change in stools or change in urine, dysuria, hematuria,  rash, arthralgias, visual complaints, headache, numbness, weakness or ataxia or problems with walking or  coordination,  change in mood or  memory.             Outpatient Medications Prior to Visit  Medication Sig Dispense Refill   albuterol (PROVENTIL) (2.5 MG/3ML) 0.083% nebulizer solution VVN Q 6 H PRF WHZ OR SOB Inhalation     cetirizine (ZYRTEC) 10 MG tablet Take 10 mg by mouth daily.     cetirizine-pseudoephedrine (ZYRTEC-D) 5-120 MG tablet Take 1 tablet by mouth 2 (two) times daily. 30 tablet 0   EPINEPHrine 0.3 mg/0.3 mL IJ SOAJ injection INJECT THE CONTENTS OF 1 SYRINGE INTO THE MUSCLE ONCE AS NEEDED FOR ANAPHYLAXIS     fluticasone (FLONASE) 50 MCG/ACT nasal spray USE 1 SPRAY IN EACH NOSTRIL TWICE DAILY IN THE MORNING AND EVENING AFTER 1 TO 2 SPRAYS OF AZELASTINE NASAL SPRAY     meloxicam (MOBIC) 15 MG tablet Take 15 mg by mouth daily.     montelukast (SINGULAIR) 10 MG tablet Take 10 mg by mouth daily.     rosuvastatin (CRESTOR) 10 MG tablet      sildenafil (VIAGRA) 50 MG tablet      tamsulosin (FLOMAX) 0.4 MG CAPS capsule Take 0.4 mg by mouth daily.     azithromycin (ZITHROMAX Z-PAK) 250 MG tablet Take 2 pills (500mg ) first day and one pill (250mg ) the remaining 4 days. (Patient not taking: Reported on 08/18/2023) 6 tablet 0   promethazine-dextromethorphan (PROMETHAZINE-DM) 6.25-15 MG/5ML syrup Take 5 mLs by mouth at bedtime as needed for cough. (Patient not taking: Reported on 08/18/2023) 118 mL 0  No facility-administered medications prior to visit.    Past Medical History:  Diagnosis Date   Allergy    Arthritis    Asthma    Sleep apnea    cpap on the way      Objective:     BP 118/72 (BP Location: Left Arm, Patient Position: Sitting, Cuff Size: Large)   Pulse 86   Temp 97.6 F (36.4 C) (Temporal)   Ht 6' (1.829 m)   Wt 228 lb 3.2 oz (103.5 kg)   SpO2 97%   BMI 30.95 kg/m   SpO2: 97 % RA   Pleasant amb bm nad   HEENT : Oropharynx  no top teeth         NECK :  without  apparent JVD/ palpable Nodes/TM    LUNGS: no acc muscle use,  Nl contour chest which  is clear to A and P bilaterally without cough on insp or exp maneuvers   CV:  RRR  no s3 or murmur or increase in P2, and no edema   ABD:  soft and nontender   MS:  Gait no   ext warm without deformities Or obvious joint restrictions  calf tenderness, cyanosis or clubbing    SKIN: warm and dry without lesions    NEURO:  alert, approp, nl sensorium with  no motor or cerebellar deficits apparent.    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  Sharolyn Douglas neb is the back up for the hfa  Re SABA :  I spent extra time with pt today reviewing appropriate use of albuterol for prn use on exertion with the following points: 1) saba is for relief of sob that does not improve by walking a slower pace or resting but rather if the pt does not improve after trying this first. 2) If the pt is convinced, as many are, that saba helps recover from activity faster then it's easy to tell if this is the case by re-challenging : ie stop, take the inhaler, then p 5 minutes try the exact same activity (intensity of workload) that just caused the symptoms and see if they are substantially diminished or not after saba 3) if there is an activity that reproducibly causes the  symptoms, try the saba 15 min before the activity on alternate days   If in fact the saba really does help, then fine to continue to use it prn but advised may need to look closer at the maintenance regimen (for now =0)  being used to achieve better control of airways disease with exertion.   F/u in 3 m  with pfts, call sooner if need short course of prednisone for flare.         Each maintenance medication was reviewed in detail including emphasizing most importantly the difference between maintenance and prns and under what circumstances the prns are to be triggered using an action plan format where appropriate.  Total time for H and P, chart review, counseling, reviewing hfa/ neb  device(s) and generating customized AVS unique to this office visit / same day charting = 45 min new pt eval.          Sandrea Hughs, MD 08/18/2023

## 2023-08-18 NOTE — Patient Instructions (Addendum)
 Only use your albuterol inhaler as a rescue medication to be used if you can't catch your breath by resting or doing a relaxed purse lip breathing pattern.  - The less you use it, the better it will work when you need it. - Ok to use the inhaler up to 2 puffs  every 4 hours if you must but call for appointment if use goes up over your usual need - Don't leave home without it !!  (think of it like the spare tire for your car)    - only use your albuterol nebulizer if you first try Plan B and it fails to help > ok to use the nebulizer up to every 4 hours but if start needing it regularly call for immediate appointment  Also  Ok to try albuterol 15 min before an activity (on alternating days)  that you know would usually make you short of breath and see if it makes any difference and if makes none then don't take albuterol after activity unless you can't catch your breath as this means it's the resting that helps, not the albuterol.   Please remember to go to the  x-ray department  for your tests - we will call you with the results when they are available     Please schedule a follow up visit in 3 months but call sooner if needed with pfts on return

## 2023-08-19 NOTE — Assessment & Plan Note (Addendum)
 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  Sharolyn Douglas neb is the back up for the hfa  Re SABA :  I spent extra time with pt today reviewing appropriate use of albuterol for prn use on exertion with the following points: 1) saba is for relief of sob that does not improve by walking a slower pace or resting but rather if the pt does not improve after trying this first. 2) If the pt is convinced, as many are, that saba helps recover from activity faster then it's easy to tell if this is the case by re-challenging : ie stop, take the inhaler, then p 5 minutes try the exact same activity (intensity of workload) that just caused the symptoms and see if they are substantially diminished or not after saba 3) if there is an activity that reproducibly causes the symptoms, try the saba 15 min before the activity on alternate days   If in fact the saba really does help, then fine to continue to use it prn but advised may need to look closer at the maintenance regimen (for now =0)  being used to achieve better control of airways disease with exertion.   F/u in 3 m with pfts, call sooner if need short course of prednisone for flare.         Each maintenance medication was reviewed in detail including emphasizing most importantly the difference between maintenance and prns and under what circumstances the prns are to be triggered using an action plan format where  appropriate.  Total time for H and P, chart review, counseling, reviewing hfa/ neb  device(s) and generating customized AVS unique to this office visit / same day charting = 45 min new pt eval.

## 2023-09-05 ENCOUNTER — Telehealth: Payer: Self-pay

## 2023-09-05 NOTE — Telephone Encounter (Signed)
 Copied from CRM [id number]. Topic: General - Call Back - No Documentation >> Sep 01, 2023  2:48 PM [name] wrote: Reason for CRM: Patient returning a phone call, patient stated no message was left and he is unsure the reason of the call, he is following up.

## 2023-09-08 ENCOUNTER — Encounter: Payer: Self-pay | Admitting: *Deleted

## 2023-09-15 NOTE — Telephone Encounter (Unsigned)
 Copied from CRM [id number]. Topic: General - Call Back - No Documentation >> Sep 01, 2023  2:48 PM Corean Deutscher wrote: Reason for CRM: Patient returning a phone call, patient stated no message was left and he is unsure the reason of the call, he is following up. >> Sep 13, 2023  2:01 PM Isabell A wrote: Patient was returning phone call for xray results - advised message in chart.

## 2023-11-18 ENCOUNTER — Ambulatory Visit: Admitting: Internal Medicine

## 2023-11-18 ENCOUNTER — Other Ambulatory Visit: Payer: Self-pay | Admitting: *Deleted

## 2023-11-18 DIAGNOSIS — J4489 Other specified chronic obstructive pulmonary disease: Secondary | ICD-10-CM

## 2023-11-18 LAB — PULMONARY FUNCTION TEST
DL/VA % pred: 110 %
DL/VA: 4.42 ml/min/mmHg/L
DLCO cor % pred: 100 %
DLCO cor: 27.62 ml/min/mmHg
DLCO unc % pred: 100 %
DLCO unc: 27.62 ml/min/mmHg
FEF 25-75 Post: 2.2 L/s
FEF 25-75 Pre: 2.32 L/s
FEF2575-%Change-Post: -5 %
FEF2575-%Pred-Post: 82 %
FEF2575-%Pred-Pre: 87 %
FEV1-%Change-Post: 0 %
FEV1-%Pred-Post: 85 %
FEV1-%Pred-Pre: 85 %
FEV1-Post: 3 L
FEV1-Pre: 3 L
FEV1FVC-%Change-Post: 1 %
FEV1FVC-%Pred-Pre: 101 %
FEV6-%Change-Post: -2 %
FEV6-%Pred-Post: 86 %
FEV6-%Pred-Pre: 88 %
FEV6-Post: 3.89 L
FEV6-Pre: 3.98 L
FEV6FVC-%Change-Post: 0 %
FEV6FVC-%Pred-Post: 104 %
FEV6FVC-%Pred-Pre: 104 %
FVC-%Change-Post: -1 %
FVC-%Pred-Post: 82 %
FVC-%Pred-Pre: 84 %
FVC-Post: 3.94 L
FVC-Pre: 4.01 L
Post FEV1/FVC ratio: 76 %
Post FEV6/FVC ratio: 99 %
Pre FEV1/FVC ratio: 75 %
Pre FEV6/FVC Ratio: 99 %
RV % pred: 99 %
RV: 2.55 L
TLC % pred: 92 %
TLC: 6.9 L

## 2023-11-18 NOTE — Patient Instructions (Signed)
 Full PFT performed today.

## 2023-11-18 NOTE — Progress Notes (Signed)
 Full PFT performed today.

## 2023-11-20 ENCOUNTER — Ambulatory Visit: Payer: Self-pay | Admitting: Internal Medicine

## 2023-11-21 NOTE — Progress Notes (Signed)
 Called the pt and there was no answer- LMTCB

## 2023-11-28 ENCOUNTER — Ambulatory Visit: Admitting: Podiatry

## 2023-11-28 DIAGNOSIS — L600 Ingrowing nail: Secondary | ICD-10-CM

## 2023-11-28 DIAGNOSIS — M79674 Pain in right toe(s): Secondary | ICD-10-CM

## 2023-11-28 MED ORDER — CEPHALEXIN 500 MG PO CAPS: 500.0000 mg | ORAL_CAPSULE | Freq: Three times a day (TID) | ORAL | 0 refills | Status: DC

## 2023-11-28 NOTE — Progress Notes (Signed)
 Subjective:   Patient ID: Ubaldo JINNY Ill, male   DOB: 68 y.o.   MRN: [id number]   HPI Chief Complaint  Patient presents with   Nail Problem    RM#12 Right big toe nail concerns nail is sore wants nail removed.   68 year old male presents the office today with his wife for concerns of his right big toenail getting tender and he was to have this removed.  He previously had removed and came back in thick.  No swelling or redness or any drainage.   Review of Systems  All other systems reviewed and are negative.  Past Medical History:  Diagnosis Date   Allergy    Arthritis    Asthma    Sleep apnea    cpap on the way    Past Surgical History:  Procedure Laterality Date   FOREIGN BODY REMOVAL Right 05/22/2014   Procedure: FOREIGN BODY REMOVAL ADULT RIGHT INDEX;  Surgeon: Arley Curia, MD;  Location: [facility];  Service: Orthopedics;  Laterality: Right;   left shoulder surgery     LUMBAR FUSION N/A 11/11/2015   Procedure: TRANSFORAMINAL LUMBAR INTERBODY FUSIONS L4-5 AND L5-S1 WITH CAGES, PEDICLE SCREWS AND RODS;  Surgeon: Lynwood FORBES Better, MD;  Location: [facility];  Service: Orthopedics;  Laterality: N/A;   NASAL SINUS SURGERY Bilateral 04/26/2022   Procedure: ENDOSCOPIC NASAL POLYPECTOMY, FRONTAL ETHMOID, MAXILLARY AND SPHENOID SINUS SURGERY;  Surgeon: Jesus Oliphant, MD;  Location: [facility];  Service: ENT;  Laterality: Bilateral;     Current Outpatient Medications:    cephALEXin (KEFLEX) 500 MG capsule, Take 1 capsule (500 mg total) by mouth 3 (three) times daily., Disp: 21 capsule, Rfl: 0   albuterol  (PROVENTIL ) (2.5 MG/3ML) 0.083% nebulizer solution, VVN Q 6 H PRF WHZ OR SOB Inhalation, Disp: , Rfl:    cetirizine  (ZYRTEC ) 10 MG tablet, Take 10 mg by mouth daily., Disp: , Rfl:    cetirizine -pseudoephedrine  (ZYRTEC -D) 5-120 MG tablet, Take 1 tablet by mouth 2 (two) times daily., Disp: 30 tablet, Rfl: 0   EPINEPHrine  0.3 mg/0.3 mL IJ SOAJ injection, INJECT THE  CONTENTS OF 1 SYRINGE INTO THE MUSCLE ONCE AS NEEDED FOR ANAPHYLAXIS, Disp: , Rfl:    fluticasone  (FLONASE ) 50 MCG/ACT nasal spray, USE 1 SPRAY IN EACH NOSTRIL TWICE DAILY IN THE MORNING AND EVENING AFTER 1 TO 2 SPRAYS OF AZELASTINE NASAL SPRAY, Disp: , Rfl:    meloxicam  (MOBIC ) 15 MG tablet, Take 15 mg by mouth daily., Disp: , Rfl:    montelukast  (SINGULAIR ) 10 MG tablet, Take 10 mg by mouth daily., Disp: , Rfl:    promethazine -dextromethorphan (PROMETHAZINE -DM) 6.25-15 MG/5ML syrup, Take 5 mLs by mouth at bedtime as needed for cough. (Patient not taking: Reported on 08/18/2023), Disp: 118 mL, Rfl: 0   rosuvastatin (CRESTOR) 10 MG tablet, , Disp: , Rfl:    sildenafil (VIAGRA) 50 MG tablet, , Disp: , Rfl:    tamsulosin (FLOMAX) 0.4 MG CAPS capsule, Take 0.4 mg by mouth daily., Disp: , Rfl:   No Known Allergies        Objective:  Physical Exam  General: AAO x3, NAD  Dermatological: Right hallux nails hypertrophic, dystrophic with yellow, brown discoloration and several debris is present.  Mild incurvation of the nail borders both medial lateral aspects.  There is no edema, erythema or signs of infection.  No open lesions.  Vascular: Dorsalis Pedis artery and Posterior Tibial artery pedal pulses are 2/4 bilateral with immedate capillary fill time. There is  no pain with calf compression, swelling, warmth, erythema.   Neruologic: Grossly intact via light touch bilateral.   Musculoskeletal: Tenderness along the right hallux nail.  No other areas of discomfort.  Gait: Unassisted, Nonantalgic.       Assessment:   Right hallux toenail pain     Plan:  -Treatment options discussed including all alternatives, risks, and complications -Etiology of symptoms were discussed -At this time, the patient is requesting total nail removal with chemical matricectomy to the symptomatic portion of the nail. Risks and complications were discussed with the patient for which they understand and written  consent was obtained. Under sterile conditions a total of 3 mL of a mixture of 2% lidocaine  plain and 0.5% Marcaine  plain was infiltrated in a hallux block fashion. Once anesthetized, the skin was prepped in sterile fashion. A tourniquet was then applied. Next the right hallux nail was then sharply excised in toto making sure to remove the entire offending nail border. Once the nails were ensured to be removed area was debrided and the underlying skin was intact. There is no purulence identified in the procedure. Next phenol was then applied under standard conditions and copiously irrigated.  Silvadene was applied. A dry sterile dressing was applied. After application of the dressing the tourniquet was removed and there is found to be an immediate capillary refill time to the digit. The patient tolerated the procedure well any complications. Post procedure instructions were discussed the patient for which he verbally understood. Discussed signs/symptoms of infection and directed to call the office immediately should any occur or go directly to the emergency room. In the meantime, encouraged to call the office with any questions, concerns, changes symptoms. -Keflex -Surgical shoe dispensed for offloading.    Donnice JONELLE Fees DPM

## 2023-11-28 NOTE — Patient Instructions (Signed)

## 2023-12-05 NOTE — Progress Notes (Deleted)
   Philip Peters, male    DOB: 09-Dec-1955   MRN: [id number]   Brief patient profile:  68 yobm  quit smoking in 2005  s apparent residual symptoms  referred to pulmonary clinic 08/18/2023 by Prentice Sharps NP  for  copd  eval   07/03/2018 CT chest no emphysema   04/2022 eval/ rx by Jesus for sinusitis/ ? 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   Dyspnea:  *** Cough: *** Sleeping: *** resp cc  SABA use: *** 02: ***  Lung cancer screening :  ***    No obvious day to day or daytime variability or assoc excess/ purulent sputum or mucus plugs or hemoptysis or cp or chest tightness, subjective wheeze or overt sinus or hb symptoms.    Also denies any obvious fluctuation of symptoms with weather or environmental changes or other aggravating or alleviating factors except as outlined above   No unusual exposure hx or h/o childhood pna/ asthma or knowledge of premature birth.  Current Allergies, Complete Past Medical History, Past Surgical History, Family History, and Social History were reviewed in [employer] record.  ROS  The following are not active complaints unless bolded Hoarseness, sore throat, dysphagia, dental problems, itching, sneezing,  nasal congestion or discharge of excess mucus or purulent secretions, ear ache,   fever, chills, sweats, unintended wt loss or wt gain, classically pleuritic or exertional cp,  orthopnea pnd or arm/hand swelling  or leg swelling, presyncope, palpitations, abdominal pain, anorexia, nausea, vomiting, diarrhea   or change in bowel habits or change in bladder habits, change in stools or change in urine, dysuria, hematuria,  rash, arthralgias, visual complaints, headache, numbness, weakness or ataxia or problems with walking or coordination,  change in mood or  memory.        No outpatient medications have been marked as taking for the 12/06/23 encounter (Appointment) with [name], MD.           Past Medical History:  Diagnosis Date   Allergy    Arthritis    Asthma    Sleep apnea    cpap on the way      Objective:    Wts  12/06/2023         ***   08/18/23 228 lb 3.2 oz (103.5 kg)  07/02/23 219 lb (99.3 kg)  04/26/22 210 lb 15.7 oz (95.7 kg)   Vital signs reviewed  12/06/2023  - Note at rest 02 sats  ***% on ***   General appearance:    ***         CXR PA and Lateral:   08/18/2023 :    I personally reviewed images and impression is as follows:    Lung volumes on low side/ no infiltrates  or effusion    Assessment

## 2023-12-06 ENCOUNTER — Ambulatory Visit: Admitting: Internal Medicine

## 2023-12-06 NOTE — Telephone Encounter (Signed)
Called,  No answer and mailbox is full

## 2023-12-08 ENCOUNTER — Telehealth: Payer: Self-pay

## 2023-12-08 NOTE — Telephone Encounter (Signed)
 Copied from CRM [id number]. Topic: Clinical - Lab/Test Results >> Dec 05, 2023 11:02 AM Philip Peters wrote: Reason for CRM: Patient would like Peters call to go over hs PFT results.   Callback number: [phone]    Tried  to call PT no answer and no way to leave message dur  to VM full.

## 2023-12-22 ENCOUNTER — Ambulatory Visit: Admitting: Podiatry

## 2024-01-30 NOTE — Progress Notes (Unsigned)
   Philip Peters, male    DOB: Feb 15, 1956   MRN: [id number]   Brief patient profile:  68 yobm  quit smoking in 2005  s apparent residual symptoms  referred to pulmonary clinic 08/18/2023 by Philip Sharps NP  for  copd  eval   07/03/2018 CT chest no emphysema   04/2022 eval/ rx by Philip Peters for sinusitis/ ? 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   Dyspnea:  *** Cough: *** Sleeping: *** resp cc  SABA use: *** 02: ***  Lung cancer screening :  ***    No obvious day to day or daytime variability or assoc excess/ purulent sputum or mucus plugs or hemoptysis or cp or chest tightness, subjective wheeze or overt sinus or hb symptoms.    Also denies any obvious fluctuation of symptoms with weather or environmental changes or other aggravating or alleviating factors except as outlined above   No unusual exposure hx or h/o childhood pna/ asthma or knowledge of premature birth.  Current Allergies, Complete Past Medical History, Past Surgical History, Family History, and Social History were reviewed in [employer] record.  ROS  The following are not active complaints unless bolded Hoarseness, sore throat, dysphagia, dental problems, itching, sneezing,  nasal congestion or discharge of excess mucus or purulent secretions, ear ache,   fever, chills, sweats, unintended wt loss or wt gain, classically pleuritic or exertional cp,  orthopnea pnd or arm/hand  swelling  or leg swelling, presyncope, palpitations, abdominal pain, anorexia, nausea, vomiting, diarrhea  or change in bowel habits or change in bladder habits, change in stools or change in urine, dysuria, hematuria,  rash, arthralgias, visual complaints, headache, numbness, weakness or ataxia or problems with walking or coordination,  change in mood or  memory.        No outpatient medications have been marked as taking for the 02/01/24 encounter (Appointment) with Philip Ozell NOVAK, MD.          Past Medical History:  Diagnosis Date   Allergy    Arthritis    Asthma    Sleep apnea    cpap on the way      Objective:    Wts  02/01/2024          ***   08/18/23 228 lb 3.2 oz (103.5 kg)  07/02/23 219 lb (99.3 kg)  04/26/22 210 lb 15.7 oz (95.7 kg)      Vital signs reviewed  02/01/2024  - Note at rest 02 sats  ***% on ***   General appearance:    ***         Assessment

## 2024-02-01 ENCOUNTER — Encounter: Payer: Self-pay | Admitting: Internal Medicine

## 2024-02-01 ENCOUNTER — Ambulatory Visit (INDEPENDENT_AMBULATORY_CARE_PROVIDER_SITE_OTHER): Admitting: Internal Medicine

## 2024-02-01 VITALS — BP 124/82 | HR 92 | Temp 98.1°F | Ht 72.0 in | Wt 224.4 lb

## 2024-02-01 DIAGNOSIS — J4489 Other specified chronic obstructive pulmonary disease: Secondary | ICD-10-CM

## 2024-02-01 NOTE — Assessment & Plan Note (Addendum)
 Quit smoking 2005  - 07/03/2018 CT chest no emphysema  -04/2022 eval/ rx by Jesus for sinusitis/ ? Allergic rhinitis -  as of 08/18/2023 MMRC1 = can walk nl pace, flat grade, can't hurry or go uphills or steps s sob   - PFT's  11/18/23   FEV1 4.01 (84 % ) ratio 0.75  p 0 % improvement from saba p 0 prior to study   and FV curve min concave and ERV 48% @  wt 227  - 02/01/2024 referred to allergy >>>  No copd/no evidence of asthma but certainly at risk of lfare with rhinitis symptoms which are likely still allergic given previous allergy w/u > since it's been > 5 y rec re-eval for allergy and just keep saba on hand in case of asthma exac related to rhinitis   Pulmonary f/u is as needed       Each maintenance medication was reviewed in detail including emphasizing most importantly the difference between maintenance and prns and under what circumstances the prns are to be triggered using an action plan format where appropriate.  Total time for H and P, chart review, counseling, reviewing hfa/nasal  device(s) and generating customized AVS unique to this office visit / same day charting = 30 min final summary f/u ov

## 2024-02-01 NOTE — Patient Instructions (Addendum)
 My office will be contacting you by phone for referral to allergy of [place]  - if you don't hear back from my office within one week please call us  back or notify us  thru MyChart and we'll address it right away.   Pulmonary follow up is as needed

## 2024-03-04 NOTE — Progress Notes (Unsigned)
 New Patient Note  RE: Philip Peters MRN: [id number] DOB: 01-31-1956 Date of Office Visit: 03/05/2024  Consult requested by: [name], MD Primary care provider: [name], FNP  Chief Complaint: No chief complaint on file.  History of Present Illness: I had the pleasure of seeing [name] for initial evaluation at the [facility] on 03/05/2024. He is a 68 y.o. male, who is referred here by [name], MD for the evaluation of ***.  Discussed the use of AI scribe software for clinical note transcription with the patient, who gave verbal consent to proceed.  History of Present Illness             02/01/2024 pulm visit: Quit smoking 2005  - 07/03/2018 CT chest no emphysema  -04/2022 eval/ rx by Jesus for sinusitis/ ? Allergic rhinitis -  as of 08/18/2023 MMRC1 = can walk nl pace, flat grade, can't hurry or go uphills or steps s sob   - PFT's  11/18/23   FEV1 4.01 (84 % ) ratio 0.75  p 0 % improvement from Philip Peters p 0 prior to study   and FV curve min concave and ERV 48% @  wt 227  - 02/01/2024 referred to allergy >>>   No copd/no evidence of asthma but certainly at risk of lfare with rhinitis symptoms which are likely still allergic given previous allergy w/u > since it's been > 5 y rec re-eval for allergy and just keep Philip Peters on hand in case of asthma exac related to rhinitis    Pulmonary f/u is as needed  Assessment and Plan: Sair is a 68 y.o. male with: ***  Assessment and Plan               No follow-ups on file.  No orders of the defined types were placed in this encounter.  Lab Orders  No laboratory test(s) ordered today    Other allergy screening: Asthma: {Blank single:19197::yes,no} Rhino conjunctivitis: {Blank single:19197::yes,no} Food allergy: {Blank single:19197::yes,no} Medication allergy: {Blank single:19197::yes,no} Hymenoptera allergy: {Blank single:19197::yes,no} Urticaria: {Blank  single:19197::yes,no} Eczema:{Blank single:19197::yes,no} History of recurrent infections suggestive of immunodeficency: {Blank single:19197::yes,no}  Diagnostics: Spirometry:  Tracings reviewed. His effort: {Blank single:19197::Good reproducible efforts.,It was hard to get consistent efforts and there is a question as to whether this reflects a maximal maneuver.,Poor effort, data can not be interpreted.} FVC: ***L FEV1: ***L, ***% predicted FEV1/FVC ratio: ***% Interpretation: {Blank single:19197::Spirometry consistent with mild obstructive disease,Spirometry consistent with moderate obstructive disease,Spirometry consistent with severe obstructive disease,Spirometry consistent with possible restrictive disease,Spirometry consistent with mixed obstructive and restrictive disease,Spirometry uninterpretable due to technique,Spirometry consistent with normal pattern,No overt abnormalities noted given today's efforts}.  Please see scanned spirometry results for details.  Skin Testing: {Blank single:19197::Select foods,Environmental allergy panel,Environmental allergy panel and select foods,Food allergy panel,None,Deferred due to recent antihistamines use}. *** Results discussed with patient/family.   Past Medical History: Patient Active Problem List   Diagnosis Date Noted  . Asthmatic bronchitis , chronic (HCC) 08/18/2023  . Encounter for screening and preventative care 03/15/2016  . Spinal stenosis, lumbar region, with neurogenic claudication 11/11/2015    Class: Chronic  . Degenerative disc disease, lumbar 11/11/2015    Class: Chronic  . Spondylolisthesis of lumbar region 11/11/2015    Class: Chronic  . Lumbar degenerative disc disease 11/11/2015  . Asthma, moderate persistent 08/01/2014   Past Medical History:  Diagnosis Date  . Allergy   . Arthritis   . Asthma   .  Sleep apnea    cpap on the way   Past Surgical History: Past  Surgical History:  Procedure Laterality Date  . FOREIGN BODY REMOVAL Right 05/22/2014   Procedure: FOREIGN BODY REMOVAL ADULT RIGHT INDEX;  Surgeon: Arley Curia, MD;  Location: [facility];  Service: Orthopedics;  Laterality: Right;  . left shoulder surgery    . LUMBAR FUSION N/A 11/11/2015   Procedure: TRANSFORAMINAL LUMBAR INTERBODY FUSIONS L4-5 AND L5-S1 WITH CAGES, PEDICLE SCREWS AND RODS;  Surgeon: Lynwood FORBES Better, MD;  Location: [facility];  Service: Orthopedics;  Laterality: N/A;  . NASAL SINUS SURGERY Bilateral 04/26/2022   Procedure: ENDOSCOPIC NASAL POLYPECTOMY, FRONTAL ETHMOID, MAXILLARY AND SPHENOID SINUS SURGERY;  Surgeon: Jesus Oliphant, MD;  Location: [facility];  Service: ENT;  Laterality: Bilateral;   Medication List:  Current Outpatient Medications  Medication Sig Dispense Refill  . albuterol  (PROVENTIL ) (2.5 MG/3ML) 0.083% nebulizer solution VVN Q 6 H PRF WHZ OR SOB Inhalation    . cetirizine  (ZYRTEC ) 10 MG tablet Take 10 mg by mouth daily.    . cetirizine -pseudoephedrine  (ZYRTEC -D) 5-120 MG tablet Take 1 tablet by mouth 2 (two) times daily. 30 tablet 0  . EPINEPHrine  0.3 mg/0.3 mL IJ SOAJ injection INJECT THE CONTENTS OF 1 SYRINGE INTO THE MUSCLE ONCE AS NEEDED FOR ANAPHYLAXIS    . fluticasone  (FLONASE ) 50 MCG/ACT nasal spray USE 1 SPRAY IN EACH NOSTRIL TWICE DAILY IN THE MORNING AND EVENING AFTER 1 TO 2 SPRAYS OF AZELASTINE NASAL SPRAY    . meloxicam  (MOBIC ) 15 MG tablet Take 15 mg by mouth daily.    . montelukast  (SINGULAIR ) 10 MG tablet Take 10 mg by mouth daily.    . rosuvastatin (CRESTOR) 10 MG tablet     . sildenafil (VIAGRA) 50 MG tablet     . tamsulosin (FLOMAX) 0.4 MG CAPS capsule Take 0.4 mg by mouth daily.     No current facility-administered medications for this visit.   Allergies: No Known Allergies Social History: Social History   Socioeconomic History  . Marital status: Married    Spouse name: Not on file  . Number of children:  Not on file  . Years of education: Not on file  . Highest education level: Not on file  Occupational History  . Not on file  Tobacco Use  . Smoking status: Former    Current packs/day: 0.00    Average packs/day: 2.0 packs/day for 35.0 years (70.0 ttl pk-yrs)    Types: Cigarettes    Start date: 01/26/1969    Quit date: 01/27/2004    Years since quitting: 20.1  . Smokeless tobacco: Never  Substance and Sexual Activity  . Alcohol use: Not Currently  . Drug use: No  . Sexual activity: Yes  Other Topics Concern  . Not on file  Social History Narrative  . Not on file   Social Drivers of Health   Financial Resource Strain: Not on file  Food Insecurity: Not on file  Transportation Needs: Not on file  Physical Activity: Not on file  Stress: Not on file  Social Connections: Not on file   Lives in a ***. Smoking: *** Occupation: ***  Environmental HistorySurveyor, minerals in the house: Network engineer in the family room: {Blank single:19197::yes,no} Carpet in the bedroom: {Blank single:19197::yes,no} Heating: {Blank single:19197::electric,gas,heat pump} Cooling: {Blank single:19197::central,window,heat pump} Pet: {Blank single:19197::yes ***,no}  Family History: Family History  Problem Relation Age of Onset  . Diabetes Father   . Cancer Sister   .  Colon cancer Neg Hx   . Colon polyps Neg Hx    Problem                               Relation Asthma                                   *** Eczema                                *** Food allergy                          *** Allergic rhino conjunctivitis     ***  Review of Systems  Constitutional:  Negative for appetite change, chills, fever and unexpected weight change.  HENT:  Negative for congestion and rhinorrhea.   Eyes:  Negative for itching.  Respiratory:  Negative for cough, chest tightness, shortness of breath and wheezing.   Cardiovascular:  Negative for chest  pain.  Gastrointestinal:  Negative for abdominal pain.  Genitourinary:  Negative for difficulty urinating.  Skin:  Negative for rash.  Neurological:  Negative for headaches.    Objective: There were no vitals taken for this visit. There is no height or weight on file to calculate BMI. Physical Exam Vitals and nursing note reviewed.  Constitutional:      Appearance: Normal appearance. He is well-developed.  HENT:     Head: Normocephalic and atraumatic.     Right Ear: Tympanic membrane and external ear normal.     Left Ear: Tympanic membrane and external ear normal.     Nose: Nose normal.     Mouth/Throat:     Mouth: Mucous membranes are moist.     Pharynx: Oropharynx is clear.  Eyes:     Conjunctiva/sclera: Conjunctivae normal.  Cardiovascular:     Rate and Rhythm: Normal rate and regular rhythm.     Heart sounds: Normal heart sounds. No murmur heard.    No friction rub. No gallop.  Pulmonary:     Effort: Pulmonary effort is normal.     Breath sounds: Normal breath sounds. No wheezing, rhonchi or rales.  Musculoskeletal:     Cervical back: Neck supple.  Skin:    General: Skin is warm.     Findings: No rash.  Neurological:     Mental Status: He is alert and oriented to person, place, and time.  Psychiatric:        Behavior: Behavior normal.   The plan was reviewed with the patient/family, and all questions/concerned were addressed.  It was my pleasure to see Philip Peters today and participate in his care. Please feel free to contact me with any questions or concerns.  Sincerely,  Orlan Cramp, DO Allergy & Immunology  [facility]  [facility] office: [phone] [facility] office: [phone]

## 2024-03-05 ENCOUNTER — Ambulatory Visit (INDEPENDENT_AMBULATORY_CARE_PROVIDER_SITE_OTHER): Admitting: Allergy

## 2024-03-05 ENCOUNTER — Other Ambulatory Visit: Payer: Self-pay

## 2024-03-05 ENCOUNTER — Encounter: Payer: Self-pay | Admitting: Allergy

## 2024-03-05 VITALS — BP 118/78 | HR 72 | Temp 98.2°F | Ht 70.5 in | Wt 220.5 lb

## 2024-03-05 DIAGNOSIS — J3089 Other allergic rhinitis: Secondary | ICD-10-CM

## 2024-03-05 DIAGNOSIS — Z8709 Personal history of other diseases of the respiratory system: Secondary | ICD-10-CM

## 2024-03-05 DIAGNOSIS — J4520 Mild intermittent asthma, uncomplicated: Secondary | ICD-10-CM

## 2024-03-05 MED ORDER — XHANCE 93 MCG/ACT NA EXHU: INHALANT_SUSPENSION | NASAL | 5 refills | Status: AC

## 2024-03-05 NOTE — Patient Instructions (Addendum)
 Requesting records.  Environmental allergies/sinus issues Past skin testing in 2023 positive to trees, grass, weeds and mold. I don't think you need repeat testing as this was done only 2 years ago. Consider allergy injections for long term control if above medications do not help the symptoms - handout given.  See below for environmental control measures. Start Xhance  (fluticasone ) nasal spray 1-2 sprays per nostril twice a day as needed for nasal congestion.  Sample given and demonstrated proper use. If this is not covered let us  know.  This will be mailed to you from [facility] pharmacy [phone]) Stop Flonase . Nasal saline spray (i.e., Simply Saline) or nasal saline lavage (i.e., NeilMed) is recommended as needed and prior to medicated nasal sprays. Continue Singulair  (montelukast ) 10mg  daily at night. Use over the counter antihistamines such as Zyrtec  (cetirizine ), Claritin (loratadine), Allegra (fexofenadine), or Xyzal (levocetirizine) daily as needed. May switch antihistamines every few months. Consider seeing ENT again.   Breathing Normal breathing test today. May use albuterol  rescue inhaler 2 puffs or nebulizer every 4 to 6 hours as needed for shortness of breath, chest tightness, coughing, and wheezing. May use albuterol  rescue inhaler 2 puffs 5 to 15 minutes prior to strenuous physical activities. Monitor frequency of use - if you need to use it more than twice per week on a consistent basis let us  know.  Spacer given and demonstrated proper use with inhaler. Patient understood technique and all questions/concerned were addressed.   Follow up in 2 months or sooner if needed.  Reducing Pollen Exposure Pollen seasons: trees (spring), grass (summer) and ragweed/weeds (fall). Keep windows closed in your home and car to lower pollen exposure.  Install air conditioning in the bedroom and throughout the house if possible.  Avoid going out in dry windy days - especially early  morning. Pollen counts are highest between 5 - 10 AM and on dry, hot and windy days.  Save outside activities for late afternoon or after a heavy rain, when pollen levels are lower.  Avoid mowing of grass if you have grass pollen allergy. Be aware that pollen can also be transported indoors on people and pets.  Dry your clothes in an automatic dryer rather than hanging them outside where they might collect pollen.  Rinse hair and eyes before bedtime.  Mold Control Mold and fungi can grow on a variety of surfaces provided certain temperature and moisture conditions exist.  Outdoor molds grow on plants, decaying vegetation and soil. The major outdoor mold, Alternaria and Cladosporium, are found in very high numbers during hot and dry conditions. Generally, a late summer - fall peak is seen for common outdoor fungal spores. Rain will temporarily lower outdoor mold spore count, but counts rise rapidly when the rainy period ends. The most important indoor molds are Aspergillus and Penicillium. Dark, humid and poorly ventilated basements are ideal sites for mold growth. The next most common sites of mold growth are the bathroom and the kitchen. Outdoor (Seasonal) Mold Control Use air conditioning and keep windows closed. Avoid exposure to decaying vegetation. Avoid leaf raking. Avoid grain handling. Consider wearing a face mask if working in moldy areas.  Indoor (Perennial) Mold Control  Maintain humidity below 50%. Get rid of mold growth on hard surfaces with water, detergent and, if necessary, 5% bleach (do not mix with other cleaners). Then dry the area completely. If mold covers an area more than 10 square feet, consider hiring an indoor environmental professional. For clothing, washing with soap and water is  best. If moldy items cannot be cleaned and dried, throw them away. Remove sources e.g. contaminated carpets. Repair and seal leaking roofs or pipes. Using dehumidifiers in damp basements  may be helpful, but empty the water and clean units regularly to prevent mildew from forming. All rooms, especially basements, bathrooms and kitchens, require ventilation and cleaning to deter mold and mildew growth. Avoid carpeting on concrete or damp floors, and storing items in damp areas.

## 2024-03-13 ENCOUNTER — Telehealth: Payer: Self-pay | Admitting: Allergy

## 2024-03-13 NOTE — Telephone Encounter (Signed)
 Pharmacy called and stated that Fluticasone  Propionate (XHANCE ) 93 MCG/ACT EXHU [[id number]] is cot covered by [employer]. The pharmacist stated we could start a pre auth, choose a different pharmacy, or choose a different medication. He would like to know which option to go with. The number is [phone]

## 2024-03-13 NOTE — Telephone Encounter (Signed)
 Please start PA for Xhance  as patient had polypectomy in the past. Thank you.

## 2024-03-14 ENCOUNTER — Telehealth: Payer: Self-pay

## 2024-03-14 NOTE — Telephone Encounter (Signed)
*  AA  Pharmacy Patient Advocate Encounter   Received notification from Pt Calls Messages that prior authorization for Xhance  93MCG/ACT exhaler suspension   is required/requested.   Insurance verification completed.   The patient is insured through [facility].   Per test claim: PA required; PA submitted to above mentioned insurance via Latent Key/confirmation #/EOC [id number] Status is pending

## 2024-03-15 NOTE — Telephone Encounter (Signed)
 Pharmacy Patient Advocate Encounter  Received notification from [employer] that Prior Authorization for Xhance  has been APPROVED from 03/14/2024 to 05/30/2025

## 2024-05-07 ENCOUNTER — Ambulatory Visit: Admitting: Allergy

## 2024-05-09 ENCOUNTER — Ambulatory Visit: Admitting: Allergy

## 2024-06-05 NOTE — Progress Notes (Deleted)
 "  Follow Up Note  RE: Philip Peters MRN: [id number] DOB: Sep 05, 1955 Date of Office Visit: 06/06/2024  Referring provider: [name], FNP Primary care provider: [name], FNP  Chief Complaint: No chief complaint on file.  History of Present Illness: I had the pleasure of seeing Philip Peters for a follow up visit at the [facility] on 06/06/2024. He is a 69 y.o. male, who is being followed for allergic rhinitis, history of nasal polyps, reactive airway disease. His previous allergy office visit was on 03/05/2024 with Dr. Luke. Today is a regular follow up visit.  Discussed the use of AI scribe software for clinical note transcription with the patient, who gave verbal consent to proceed.  History of Present Illness            ***  Assessment and Plan: Aaro is a 69 y.o. male with: Other allergic rhinitis History of nasal polyposis Chronic allergic rhinitis with significant nasal congestion s/p nasal surgery/polypectomy. Skin testing in 2023 positive to trees, grass, weeds and mold and was on AIT for a few months but stopped as he didn't want to get the shots so often.  I don't think you need repeat testing as this was done only 2 years ago. Consider allergy injections for long term control if above medications do not help the symptoms - handout given.  See below for environmental control measures. Start Xhance  (fluticasone ) nasal spray 1-2 sprays per nostril twice a day as needed for nasal congestion.  Sample given and demonstrated proper use. If this is not covered let us  know.  This will be mailed to you from [facility] pharmacy [phone]) Stop Flonase . Nasal saline spray (i.e., Simply Saline) or nasal saline lavage (i.e., NeilMed) is recommended as needed and prior to medicated nasal sprays. Continue Singulair  (montelukast ) 10mg  daily at night. Use over the counter antihistamines such as Zyrtec  (cetirizine ), Claritin (loratadine), Allegra  (fexofenadine), or Xyzal (levocetirizine) daily as needed. May switch antihistamines every few months. Consider seeing ENT again.  Requesting records.    Mild intermittent reactive airway disease Intermittent symptoms managed with albuterol . No recent exacerbations requiring ER visits or hospitalizations. Saw pulm in the past. Today's spirometry was normal with no improvement in FEV1 post bronchodilator treatment. Clinically feeling slightly improved.  May use albuterol  rescue inhaler 2 puffs or nebulizer every 4 to 6 hours as needed for shortness of breath, chest tightness, coughing, and wheezing. May use albuterol  rescue inhaler 2 puffs 5 to 15 minutes prior to strenuous physical activities. Monitor frequency of use - if you need to use it more than twice per week on a consistent basis let us  know.  Spacer given and demonstrated proper use with inhaler. Patient understood technique and all questions/concerned were addressed.  Assessment and Plan              No follow-ups on file.  No orders of the defined types were placed in this encounter.  Lab Orders  No laboratory test(s) ordered today    Diagnostics: Spirometry:  Tracings reviewed. His effort: {Blank single:19197::Good reproducible efforts.,It was hard to get consistent efforts and there is a question as to whether this reflects a maximal maneuver.,Poor effort, data can not be interpreted.} FVC: ***L FEV1: ***L, ***% predicted FEV1/FVC ratio: ***% Interpretation: {Blank single:19197::Spirometry consistent with mild obstructive disease,Spirometry consistent with moderate obstructive disease,Spirometry consistent with severe obstructive disease,Spirometry consistent with possible restrictive disease,Spirometry consistent with mixed obstructive and restrictive disease,Spirometry uninterpretable due  to technique,Spirometry consistent with normal pattern,No overt abnormalities noted given today's efforts}.   Please see scanned spirometry results for details.  Skin Testing: {Blank single:19197::Select foods,Environmental allergy panel,Environmental allergy panel and select foods,Food allergy panel,None,Deferred due to recent antihistamines use}. *** Results discussed with patient/family.   Medication List:  Current Outpatient Medications  Medication Sig Dispense Refill   albuterol  (PROVENTIL ) (2.5 MG/3ML) 0.083% nebulizer solution VVN Q 6 H PRF WHZ OR SOB Inhalation     cetirizine  (ZYRTEC ) 10 MG tablet Take 10 mg by mouth daily.     EPINEPHrine  0.3 mg/0.3 mL IJ SOAJ injection INJECT THE CONTENTS OF 1 SYRINGE INTO THE MUSCLE ONCE AS NEEDED FOR ANAPHYLAXIS     Fluticasone  Propionate (XHANCE ) 93 MCG/ACT EXHU 1-2 sprays per nostril twice a day. 16 mL 5   meloxicam  (MOBIC ) 15 MG tablet Take 15 mg by mouth daily.     montelukast  (SINGULAIR ) 10 MG tablet Take 10 mg by mouth daily.     rosuvastatin (CRESTOR) 10 MG tablet      sildenafil (VIAGRA) 50 MG tablet      tamsulosin (FLOMAX) 0.4 MG CAPS capsule Take 0.4 mg by mouth daily.     No current facility-administered medications for this visit.   Allergies: Allergies[1] I reviewed his past medical history, social history, family history, and environmental history and no significant changes have been reported from his previous visit.  Review of Systems  Constitutional:  Negative for appetite change, chills, fever and unexpected weight change.  HENT:  Positive for congestion. Negative for rhinorrhea.   Eyes:  Negative for itching.  Respiratory:  Negative for cough, chest tightness, shortness of breath and wheezing.   Cardiovascular:  Negative for chest pain.  Gastrointestinal:  Negative for abdominal pain.  Genitourinary:  Negative for difficulty urinating.  Skin:  Negative for rash.  Allergic/Immunologic: Positive for environmental allergies.  Neurological:  Positive for headaches.    Objective: There were no vitals  taken for this visit. There is no height or weight on file to calculate BMI. Physical Exam Vitals and nursing note reviewed.  Constitutional:      Appearance: Normal appearance. He is well-developed.  HENT:     Head: Normocephalic and atraumatic.     Right Ear: Tympanic membrane and external ear normal.     Left Ear: Tympanic membrane and external ear normal.     Nose: Congestion (b/l) present.     Mouth/Throat:     Mouth: Mucous membranes are moist.     Pharynx: Oropharynx is clear.  Eyes:     Conjunctiva/sclera: Conjunctivae normal.  Cardiovascular:     Rate and Rhythm: Normal rate and regular rhythm.     Heart sounds: Normal heart sounds. No murmur heard.    No friction rub. No gallop.  Pulmonary:     Effort: Pulmonary effort is normal.     Breath sounds: Normal breath sounds. No wheezing, rhonchi or rales.  Musculoskeletal:     Cervical back: Neck supple.  Skin:    General: Skin is warm.     Findings: No rash.  Neurological:     Mental Status: He is alert and oriented to person, place, and time.  Psychiatric:        Behavior: Behavior normal.   Previous notes and tests were reviewed. The plan was reviewed with the patient/family, and all questions/concerned were addressed.  It was my pleasure to see Philip Peters today and participate in his care. Please feel free to contact me with any  questions or concerns.  Sincerely,  Orlan Cramp, DO Allergy & Immunology  [facility]  [facility] office: [phone] [facility] office: [phone]     [1] No Known Allergies "

## 2024-06-06 ENCOUNTER — Ambulatory Visit: Admitting: Allergy
